# Patient Record
Sex: Male | Born: 1950 | Race: Black or African American | Hispanic: No | Marital: Single | Smoking: Current some day smoker
Health system: Southern US, Community
[De-identification: ages and names within clinical notes are randomized; demographics above are authoritative.]

## PROBLEM LIST (undated history)

## (undated) DIAGNOSIS — I10 Essential (primary) hypertension: Secondary | ICD-10-CM

## (undated) DIAGNOSIS — I639 Cerebral infarction, unspecified: Secondary | ICD-10-CM

## (undated) DIAGNOSIS — C801 Malignant (primary) neoplasm, unspecified: Secondary | ICD-10-CM

## (undated) HISTORY — DX: Essential (primary) hypertension: I10

---

## 2007-11-23 ENCOUNTER — Emergency Department (HOSPITAL_COMMUNITY): Admission: EM | Admit: 2007-11-23 | Discharge: 2007-11-23 | Payer: Self-pay | Admitting: Family Medicine

## 2007-12-21 ENCOUNTER — Emergency Department (HOSPITAL_COMMUNITY): Admission: EM | Admit: 2007-12-21 | Discharge: 2007-12-21 | Payer: Self-pay | Admitting: Emergency Medicine

## 2008-03-18 ENCOUNTER — Emergency Department (HOSPITAL_COMMUNITY): Admission: EM | Admit: 2008-03-18 | Discharge: 2008-03-18 | Payer: Self-pay | Admitting: Emergency Medicine

## 2008-04-26 HISTORY — PX: OTHER SURGICAL HISTORY: SHX169

## 2008-06-27 ENCOUNTER — Emergency Department (HOSPITAL_COMMUNITY): Admission: EM | Admit: 2008-06-27 | Discharge: 2008-06-27 | Payer: Self-pay | Admitting: Family Medicine

## 2008-07-22 ENCOUNTER — Emergency Department (HOSPITAL_COMMUNITY): Admission: EM | Admit: 2008-07-22 | Discharge: 2008-07-22 | Payer: Self-pay | Admitting: Emergency Medicine

## 2008-07-26 ENCOUNTER — Inpatient Hospital Stay (HOSPITAL_COMMUNITY): Admission: EM | Admit: 2008-07-26 | Discharge: 2008-08-09 | Payer: Self-pay | Admitting: *Deleted

## 2008-08-20 ENCOUNTER — Encounter: Admission: RE | Admit: 2008-08-20 | Discharge: 2008-11-18 | Payer: Self-pay | Admitting: Plastic Surgery

## 2008-11-19 ENCOUNTER — Encounter: Admission: RE | Admit: 2008-11-19 | Discharge: 2009-01-22 | Payer: Self-pay | Admitting: Plastic Surgery
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
## 2010-02-22 ENCOUNTER — Emergency Department (HOSPITAL_COMMUNITY): Admission: EM | Admit: 2010-02-22 | Discharge: 2010-02-22 | Payer: Self-pay | Admitting: Emergency Medicine

## 2010-08-05 LAB — CBC
HCT: 26.8 % — ABNORMAL LOW (ref 39.0–52.0)
HCT: 29.1 % — ABNORMAL LOW (ref 39.0–52.0)
HCT: 31.8 % — ABNORMAL LOW (ref 39.0–52.0)
HCT: 42.8 % (ref 39.0–52.0)
Hemoglobin: 10 g/dL — ABNORMAL LOW (ref 13.0–17.0)
Hemoglobin: 10.8 g/dL — ABNORMAL LOW (ref 13.0–17.0)
Hemoglobin: 14.3 g/dL (ref 13.0–17.0)
Hemoglobin: 9.2 g/dL — ABNORMAL LOW (ref 13.0–17.0)
MCHC: 33.5 g/dL (ref 30.0–36.0)
MCHC: 33.7 g/dL (ref 30.0–36.0)
MCHC: 34.2 g/dL (ref 30.0–36.0)
MCHC: 34.3 g/dL (ref 30.0–36.0)
MCV: 96.7 fL (ref 78.0–100.0)
MCV: 97 fL (ref 78.0–100.0)
MCV: 98.7 fL (ref 78.0–100.0)
MCV: 98.9 fL (ref 78.0–100.0)
Platelets: 106 10*3/uL — ABNORMAL LOW (ref 150–400)
Platelets: 112 10*3/uL — ABNORMAL LOW (ref 150–400)
Platelets: 115 10*3/uL — ABNORMAL LOW (ref 150–400)
Platelets: 162 10*3/uL (ref 150–400)
Platelets: 177 10*3/uL (ref 150–400)
RBC: 2.76 MIL/uL — ABNORMAL LOW (ref 4.22–5.81)
RBC: 3.01 MIL/uL — ABNORMAL LOW (ref 4.22–5.81)
RBC: 3.22 MIL/uL — ABNORMAL LOW (ref 4.22–5.81)
RBC: 4.33 MIL/uL (ref 4.22–5.81)
RDW: 12.8 % (ref 11.5–15.5)
RDW: 13.3 % (ref 11.5–15.5)
RDW: 13.5 % (ref 11.5–15.5)
RDW: 13.7 % (ref 11.5–15.5)
WBC: 4.2 10*3/uL (ref 4.0–10.5)
WBC: 4.5 10*3/uL (ref 4.0–10.5)
WBC: 5.2 10*3/uL (ref 4.0–10.5)
WBC: 5.8 10*3/uL (ref 4.0–10.5)
WBC: 7.2 10*3/uL (ref 4.0–10.5)

## 2010-08-05 LAB — DIFFERENTIAL
Basophils Absolute: 0 10*3/uL (ref 0.0–0.1)
Basophils Relative: 1 % (ref 0–1)
Eosinophils Absolute: 0.1 10*3/uL (ref 0.0–0.7)
Eosinophils Relative: 1 % (ref 0–5)
Lymphocytes Relative: 57 % — ABNORMAL HIGH (ref 12–46)
Lymphs Abs: 4.1 10*3/uL — ABNORMAL HIGH (ref 0.7–4.0)
Monocytes Absolute: 0.6 10*3/uL (ref 0.1–1.0)
Monocytes Relative: 8 % (ref 3–12)
Neutro Abs: 2.4 10*3/uL (ref 1.7–7.7)
Neutrophils Relative %: 33 % — ABNORMAL LOW (ref 43–77)

## 2010-08-05 LAB — COMPREHENSIVE METABOLIC PANEL
ALT: 38 U/L (ref 0–53)
AST: 66 U/L — ABNORMAL HIGH (ref 0–37)
Albumin: 2.8 g/dL — ABNORMAL LOW (ref 3.5–5.2)
Albumin: 3.8 g/dL (ref 3.5–5.2)
Alkaline Phosphatase: 75 U/L (ref 39–117)
BUN: 11 mg/dL (ref 6–23)
BUN: 7 mg/dL (ref 6–23)
CO2: 18 mEq/L — ABNORMAL LOW (ref 19–32)
Calcium: 8.3 mg/dL — ABNORMAL LOW (ref 8.4–10.5)
Calcium: 9.1 mg/dL (ref 8.4–10.5)
Chloride: 100 mEq/L (ref 96–112)
Chloride: 103 mEq/L (ref 96–112)
Creatinine, Ser: 1.08 mg/dL (ref 0.4–1.5)
Creatinine, Ser: 1.53 mg/dL — ABNORMAL HIGH (ref 0.4–1.5)
GFR calc Af Amer: 57 mL/min — ABNORMAL LOW (ref >60)
GFR calc non Af Amer: 47 mL/min — ABNORMAL LOW (ref >60)
Glucose, Bld: 126 mg/dL — ABNORMAL HIGH (ref 70–99)
Potassium: 4.1 mEq/L (ref 3.5–5.1)
Sodium: 140 mEq/L (ref 135–145)
Total Bilirubin: 0.9 mg/dL (ref 0.3–1.2)
Total Bilirubin: 1.3 mg/dL — ABNORMAL HIGH (ref 0.3–1.2)
Total Protein: 7.2 g/dL (ref 6.0–8.3)

## 2010-08-05 LAB — PROTIME-INR
INR: 0.9 (ref 0.00–1.49)
Prothrombin Time: 12.7 seconds (ref 11.6–15.2)

## 2010-08-05 LAB — BASIC METABOLIC PANEL
BUN: 5 mg/dL — ABNORMAL LOW (ref 6–23)
BUN: 8 mg/dL (ref 6–23)
CO2: 30 mEq/L (ref 19–32)
Calcium: 8.4 mg/dL (ref 8.4–10.5)
Chloride: 98 mEq/L (ref 96–112)
Creatinine, Ser: 0.79 mg/dL (ref 0.4–1.5)
Creatinine, Ser: 1.02 mg/dL (ref 0.4–1.5)
GFR calc Af Amer: >60 mL/min (ref >60)
GFR calc Af Amer: >60 mL/min (ref >60)
GFR calc non Af Amer: >60 mL/min (ref >60)
GFR calc non Af Amer: >60 mL/min (ref >60)
Glucose, Bld: 101 mg/dL — ABNORMAL HIGH (ref 70–99)
Potassium: 3.6 mEq/L (ref 3.5–5.1)
Potassium: 3.7 mEq/L (ref 3.5–5.1)
Sodium: 135 mEq/L (ref 135–145)

## 2010-08-05 LAB — T4, FREE: Free T4: 1.04 ng/dL (ref 0.89–1.80)

## 2010-08-05 LAB — CREATININE, URINE, RANDOM: Creatinine, Urine: 70.4 mg/dL

## 2010-08-05 LAB — APTT: aPTT: 29 seconds (ref 24–37)

## 2010-08-05 LAB — ABO/RH: ABO/RH(D): B POS

## 2010-08-05 LAB — TYPE AND SCREEN
ABO/RH(D): B POS
Antibody Screen: NEGATIVE

## 2010-08-05 LAB — CK: Total CK: 153 U/L (ref 7–232)

## 2010-08-05 LAB — GLUCOSE, CAPILLARY: Glucose-Capillary: 204 mg/dL — ABNORMAL HIGH (ref 70–99)

## 2010-08-05 LAB — TSH: TSH: 1.964 u[IU]/mL (ref 0.350–4.500)

## 2010-08-05 LAB — ETHANOL: Alcohol, Ethyl (B): 9 mg/dL (ref 0–10)

## 2010-08-05 LAB — CK TOTAL AND CKMB (NOT AT ARMC)
CK, MB: 2.3 ng/mL (ref 0.3–4.0)
Relative Index: 0.3 (ref 0.0–2.5)
Total CK: 762 U/L — ABNORMAL HIGH (ref 7–232)

## 2010-08-05 LAB — SODIUM, URINE, RANDOM: Sodium, Ur: 39 mEq/L
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
## 2010-10-06 ENCOUNTER — Inpatient Hospital Stay (INDEPENDENT_AMBULATORY_CARE_PROVIDER_SITE_OTHER)
Admission: RE | Admit: 2010-10-06 | Discharge: 2010-10-06 | Disposition: A | Discharge status: Home / Self Care | Payer: Self-pay | Source: Ambulatory Visit | Attending: Family Medicine | Admitting: Family Medicine

## 2010-10-06 DIAGNOSIS — B351 Tinea unguium: Secondary | ICD-10-CM

## 2010-10-06 DIAGNOSIS — J45909 Unspecified asthma, uncomplicated: Secondary | ICD-10-CM

## 2010-10-06 DIAGNOSIS — I10 Essential (primary) hypertension: Secondary | ICD-10-CM

## 2010-10-13 ENCOUNTER — Other Ambulatory Visit: Payer: Self-pay | Admitting: Infectious Diseases

## 2010-10-13 ENCOUNTER — Ambulatory Visit
Admission: RE | Admit: 2010-10-13 | Discharge: 2010-10-13 | Disposition: A | Discharge status: Home / Self Care | Payer: No Typology Code available for payment source | Source: Ambulatory Visit | Attending: Infectious Diseases | Admitting: Infectious Diseases

## 2010-10-13 DIAGNOSIS — R7611 Nonspecific reaction to tuberculin skin test without active tuberculosis: Secondary | ICD-10-CM

## 2010-11-01 ENCOUNTER — Emergency Department (HOSPITAL_COMMUNITY)
Admission: EM | Admit: 2010-11-01 | Discharge: 2010-11-02 | Disposition: A | Discharge status: Home / Self Care | Payer: Self-pay | Attending: Emergency Medicine | Admitting: Emergency Medicine

## 2010-11-01 DIAGNOSIS — Z79899 Other long term (current) drug therapy: Secondary | ICD-10-CM | POA: Insufficient documentation

## 2010-11-01 DIAGNOSIS — Z87828 Personal history of other (healed) physical injury and trauma: Secondary | ICD-10-CM | POA: Insufficient documentation

## 2010-11-01 DIAGNOSIS — J45909 Unspecified asthma, uncomplicated: Secondary | ICD-10-CM | POA: Insufficient documentation

## 2010-11-01 DIAGNOSIS — I10 Essential (primary) hypertension: Secondary | ICD-10-CM | POA: Insufficient documentation

## 2010-11-01 DIAGNOSIS — M79609 Pain in unspecified limb: Secondary | ICD-10-CM | POA: Insufficient documentation

## 2010-11-06 ENCOUNTER — Emergency Department (HOSPITAL_COMMUNITY): Payer: Self-pay

## 2010-11-06 ENCOUNTER — Emergency Department (HOSPITAL_COMMUNITY)
Admission: EM | Admit: 2010-11-06 | Discharge: 2010-11-06 | Disposition: A | Discharge status: Home / Self Care | Payer: Self-pay | Attending: Emergency Medicine | Admitting: Emergency Medicine

## 2010-11-06 DIAGNOSIS — J45909 Unspecified asthma, uncomplicated: Secondary | ICD-10-CM | POA: Insufficient documentation

## 2010-11-06 DIAGNOSIS — M79609 Pain in unspecified limb: Secondary | ICD-10-CM | POA: Insufficient documentation

## 2010-11-06 DIAGNOSIS — L84 Corns and callosities: Secondary | ICD-10-CM | POA: Insufficient documentation

## 2010-11-06 DIAGNOSIS — I10 Essential (primary) hypertension: Secondary | ICD-10-CM | POA: Insufficient documentation

## 2010-11-06 DIAGNOSIS — B351 Tinea unguium: Secondary | ICD-10-CM | POA: Insufficient documentation

## 2010-11-13 ENCOUNTER — Emergency Department (HOSPITAL_COMMUNITY)
Admission: EM | Admit: 2010-11-13 | Discharge: 2010-11-13 | Disposition: A | Discharge status: Home / Self Care | Payer: Self-pay | Attending: Emergency Medicine | Admitting: Emergency Medicine

## 2010-11-13 DIAGNOSIS — J45909 Unspecified asthma, uncomplicated: Secondary | ICD-10-CM | POA: Insufficient documentation

## 2010-11-13 DIAGNOSIS — Z8639 Personal history of other endocrine, nutritional and metabolic disease: Secondary | ICD-10-CM | POA: Insufficient documentation

## 2010-11-13 DIAGNOSIS — Z862 Personal history of diseases of the blood and blood-forming organs and certain disorders involving the immune mechanism: Secondary | ICD-10-CM | POA: Insufficient documentation

## 2010-11-13 DIAGNOSIS — I10 Essential (primary) hypertension: Secondary | ICD-10-CM | POA: Insufficient documentation

## 2010-11-13 DIAGNOSIS — M79609 Pain in unspecified limb: Secondary | ICD-10-CM | POA: Insufficient documentation

## 2010-11-23 ENCOUNTER — Emergency Department (HOSPITAL_COMMUNITY): Payer: Self-pay

## 2010-11-23 ENCOUNTER — Emergency Department (HOSPITAL_COMMUNITY)
Admission: EM | Admit: 2010-11-23 | Discharge: 2010-11-23 | Disposition: A | Discharge status: Home / Self Care | Payer: Self-pay | Attending: Emergency Medicine | Admitting: Emergency Medicine

## 2010-11-23 DIAGNOSIS — Z79899 Other long term (current) drug therapy: Secondary | ICD-10-CM | POA: Insufficient documentation

## 2010-11-23 DIAGNOSIS — I10 Essential (primary) hypertension: Secondary | ICD-10-CM | POA: Insufficient documentation

## 2010-11-23 DIAGNOSIS — M5137 Other intervertebral disc degeneration, lumbosacral region: Secondary | ICD-10-CM | POA: Insufficient documentation

## 2010-11-23 DIAGNOSIS — Z862 Personal history of diseases of the blood and blood-forming organs and certain disorders involving the immune mechanism: Secondary | ICD-10-CM | POA: Insufficient documentation

## 2010-11-23 DIAGNOSIS — M7989 Other specified soft tissue disorders: Secondary | ICD-10-CM | POA: Insufficient documentation

## 2010-11-23 DIAGNOSIS — X58XXXA Exposure to other specified factors, initial encounter: Secondary | ICD-10-CM | POA: Insufficient documentation

## 2010-11-23 DIAGNOSIS — M51379 Other intervertebral disc degeneration, lumbosacral region without mention of lumbar back pain or lower extremity pain: Secondary | ICD-10-CM | POA: Insufficient documentation

## 2010-11-23 DIAGNOSIS — M545 Low back pain, unspecified: Secondary | ICD-10-CM | POA: Insufficient documentation

## 2010-11-23 DIAGNOSIS — S335XXA Sprain of ligaments of lumbar spine, initial encounter: Secondary | ICD-10-CM | POA: Insufficient documentation

## 2010-11-23 DIAGNOSIS — M79609 Pain in unspecified limb: Secondary | ICD-10-CM | POA: Insufficient documentation

## 2010-11-23 DIAGNOSIS — Z8639 Personal history of other endocrine, nutritional and metabolic disease: Secondary | ICD-10-CM | POA: Insufficient documentation

## 2010-11-24 ENCOUNTER — Encounter: Payer: Self-pay | Admitting: Podiatry

## 2010-11-24 DIAGNOSIS — I10 Essential (primary) hypertension: Secondary | ICD-10-CM

## 2011-01-17 ENCOUNTER — Emergency Department (HOSPITAL_COMMUNITY): Payer: Self-pay

## 2011-01-17 ENCOUNTER — Emergency Department (HOSPITAL_COMMUNITY)
Admission: EM | Admit: 2011-01-17 | Discharge: 2011-01-18 | Disposition: A | Discharge status: Home / Self Care | Payer: Self-pay | Attending: Emergency Medicine | Admitting: Emergency Medicine

## 2011-01-17 DIAGNOSIS — W010XXA Fall on same level from slipping, tripping and stumbling without subsequent striking against object, initial encounter: Secondary | ICD-10-CM | POA: Insufficient documentation

## 2011-01-17 DIAGNOSIS — M79609 Pain in unspecified limb: Secondary | ICD-10-CM | POA: Insufficient documentation

## 2011-01-17 DIAGNOSIS — M25539 Pain in unspecified wrist: Secondary | ICD-10-CM | POA: Insufficient documentation

## 2011-01-17 DIAGNOSIS — M21619 Bunion of unspecified foot: Secondary | ICD-10-CM | POA: Insufficient documentation

## 2011-01-17 DIAGNOSIS — I10 Essential (primary) hypertension: Secondary | ICD-10-CM | POA: Insufficient documentation

## 2011-01-17 DIAGNOSIS — Z862 Personal history of diseases of the blood and blood-forming organs and certain disorders involving the immune mechanism: Secondary | ICD-10-CM | POA: Insufficient documentation

## 2011-01-17 DIAGNOSIS — Z8639 Personal history of other endocrine, nutritional and metabolic disease: Secondary | ICD-10-CM | POA: Insufficient documentation

## 2011-01-19 ENCOUNTER — Emergency Department (HOSPITAL_COMMUNITY)
Admission: EM | Admit: 2011-01-19 | Discharge: 2011-01-19 | Disposition: A | Discharge status: Home / Self Care | Payer: Self-pay | Attending: Emergency Medicine | Admitting: Emergency Medicine

## 2011-04-08 ENCOUNTER — Emergency Department (HOSPITAL_COMMUNITY)
Admission: EM | Admit: 2011-04-08 | Discharge: 2011-04-08 | Disposition: A | Discharge status: Home / Self Care | Payer: Self-pay | Attending: Emergency Medicine | Admitting: Emergency Medicine

## 2011-04-08 ENCOUNTER — Encounter (HOSPITAL_COMMUNITY): Payer: Self-pay

## 2011-04-08 DIAGNOSIS — M19079 Primary osteoarthritis, unspecified ankle and foot: Secondary | ICD-10-CM | POA: Insufficient documentation

## 2011-04-08 DIAGNOSIS — M1990 Unspecified osteoarthritis, unspecified site: Secondary | ICD-10-CM

## 2011-04-08 DIAGNOSIS — B351 Tinea unguium: Secondary | ICD-10-CM | POA: Insufficient documentation

## 2011-04-08 DIAGNOSIS — M79672 Pain in left foot: Secondary | ICD-10-CM

## 2011-04-08 DIAGNOSIS — M79609 Pain in unspecified limb: Secondary | ICD-10-CM | POA: Insufficient documentation

## 2011-04-08 MED ORDER — TERBINAFINE HCL 1 % EX CREA: TOPICAL_CREAM | Freq: Two times a day (BID) | CUTANEOUS | Status: DC

## 2011-04-08 MED ORDER — INDOMETHACIN 50 MG PO CAPS: 50.0000 mg | ORAL_CAPSULE | Freq: Two times a day (BID) | ORAL | Status: AC

## 2011-04-08 NOTE — ED Provider Notes (Signed)
Medical screening examination/treatment/procedure(s) were performed by non-physician practitioner and as supervising physician I was immediately available for consultation/collaboration.  Flint Melter, MD 04/08/11 [phone]

## 2011-04-08 NOTE — ED Notes (Signed)
Lt,. Foot pain pt. Believes he might have GOUT,. And also has a bulllet in his lt. Forearm and it hurts when the weather become cold
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
## 2011-11-03 ENCOUNTER — Emergency Department (HOSPITAL_COMMUNITY): Payer: No Typology Code available for payment source

## 2011-11-03 ENCOUNTER — Emergency Department (HOSPITAL_COMMUNITY)
Admission: EM | Admit: 2011-11-03 | Discharge: 2011-11-03 | Disposition: A | Discharge status: Home / Self Care | Payer: No Typology Code available for payment source | Attending: Emergency Medicine | Admitting: Emergency Medicine

## 2011-11-03 ENCOUNTER — Encounter (HOSPITAL_COMMUNITY): Payer: Self-pay | Admitting: Emergency Medicine

## 2011-11-03 DIAGNOSIS — I10 Essential (primary) hypertension: Secondary | ICD-10-CM | POA: Insufficient documentation

## 2011-11-03 DIAGNOSIS — F17200 Nicotine dependence, unspecified, uncomplicated: Secondary | ICD-10-CM | POA: Insufficient documentation

## 2011-11-03 DIAGNOSIS — Y92410 Unspecified street and highway as the place of occurrence of the external cause: Secondary | ICD-10-CM | POA: Insufficient documentation

## 2011-11-03 MED ORDER — CLONIDINE HCL 0.2 MG PO TABS: 0.2000 mg | ORAL_TABLET | Freq: Two times a day (BID) | ORAL | Status: DC

## 2011-11-03 MED ORDER — HYDROCODONE-ACETAMINOPHEN 5-325 MG PO TABS
1.0000 | ORAL_TABLET | Freq: Once | ORAL | Status: AC
  Administered 2011-11-03: 1 via ORAL
  Filled 2011-11-03: qty 1

## 2011-11-03 MED ORDER — HYDROCODONE-ACETAMINOPHEN 5-325 MG PO TABS: 1.0000 | ORAL_TABLET | Freq: Four times a day (QID) | ORAL | Status: DC | PRN

## 2011-11-03 MED ORDER — CLONIDINE HCL 0.1 MG PO TABS
0.2000 mg | ORAL_TABLET | Freq: Once | ORAL | Status: AC
  Administered 2011-11-03: 0.2 mg via ORAL
  Filled 2011-11-03: qty 2

## 2011-11-03 MED ORDER — KETOROLAC TROMETHAMINE 60 MG/2ML IM SOLN
60.0000 mg | Freq: Once | INTRAMUSCULAR | Status: AC
  Administered 2011-11-03: 60 mg via INTRAMUSCULAR
  Filled 2011-11-03: qty 2

## 2011-11-03 MED ORDER — LORAZEPAM 1 MG PO TABS
1.0000 mg | ORAL_TABLET | Freq: Once | ORAL | Status: AC
  Administered 2011-11-03: 1 mg via ORAL
  Filled 2011-11-03: qty 1
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
## 2011-11-03 NOTE — Progress Notes (Signed)
Pt reports going to DSS, social security and the school this morning with his family prior to [facility]
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
## 2011-11-03 NOTE — ED Notes (Signed)
Discharge instructions reviewed w/ pt., verbalizes understanding. Two prescriptions provided at discharge. 

## 2011-11-03 NOTE — Progress Notes (Signed)
[facility] ED CM spoke with pt who states he is medicaid He presented his card which CM provided to ED registration to copy and enter in [facility] Medicaid card lists [facility] Pt states he did not have pcp and interested in seeing a provider- Dr Mayford Knife (clinic) on [address] [place] across from [employer]

## 2011-11-04 ENCOUNTER — Encounter (HOSPITAL_COMMUNITY): Payer: Self-pay | Admitting: *Deleted

## 2011-11-04 ENCOUNTER — Emergency Department (HOSPITAL_COMMUNITY): Payer: Medicaid Other

## 2011-11-04 ENCOUNTER — Inpatient Hospital Stay (HOSPITAL_COMMUNITY)
Admission: EM | Admit: 2011-11-04 | Discharge: 2011-11-11 | DRG: 536 | Disposition: A | Discharge status: Rehabilitation Facility | Payer: Medicaid Other | Attending: General Surgery | Admitting: General Surgery

## 2011-11-04 DIAGNOSIS — R112 Nausea with vomiting, unspecified: Secondary | ICD-10-CM | POA: Diagnosis not present

## 2011-11-04 DIAGNOSIS — Y92410 Unspecified street and highway as the place of occurrence of the external cause: Secondary | ICD-10-CM

## 2011-11-04 DIAGNOSIS — I10 Essential (primary) hypertension: Secondary | ICD-10-CM | POA: Diagnosis present

## 2011-11-04 DIAGNOSIS — S32599A Other specified fracture of unspecified pubis, initial encounter for closed fracture: Secondary | ICD-10-CM | POA: Diagnosis present

## 2011-11-04 DIAGNOSIS — S32810A Multiple fractures of pelvis with stable disruption of pelvic ring, initial encounter for closed fracture: Secondary | ICD-10-CM

## 2011-11-04 DIAGNOSIS — S3210XA Unspecified fracture of sacrum, initial encounter for closed fracture: Secondary | ICD-10-CM | POA: Diagnosis present

## 2011-11-04 DIAGNOSIS — S329XXA Fracture of unspecified parts of lumbosacral spine and pelvis, initial encounter for closed fracture: Secondary | ICD-10-CM

## 2011-11-04 DIAGNOSIS — J9819 Other pulmonary collapse: Secondary | ICD-10-CM | POA: Diagnosis present

## 2011-11-04 DIAGNOSIS — S32401A Unspecified fracture of right acetabulum, initial encounter for closed fracture: Secondary | ICD-10-CM | POA: Diagnosis present

## 2011-11-04 DIAGNOSIS — F17200 Nicotine dependence, unspecified, uncomplicated: Secondary | ICD-10-CM | POA: Diagnosis present

## 2011-11-04 DIAGNOSIS — Y998 Other external cause status: Secondary | ICD-10-CM

## 2011-11-04 DIAGNOSIS — Z79899 Other long term (current) drug therapy: Secondary | ICD-10-CM

## 2011-11-04 DIAGNOSIS — D62 Acute posthemorrhagic anemia: Secondary | ICD-10-CM | POA: Diagnosis not present

## 2011-11-04 DIAGNOSIS — S3282XA Multiple fractures of pelvis without disruption of pelvic ring, initial encounter for closed fracture: Principal | ICD-10-CM | POA: Diagnosis present

## 2011-11-04 LAB — CBC WITH DIFFERENTIAL/PLATELET
Eosinophils Absolute: 0 10*3/uL (ref 0.0–0.7)
Eosinophils Relative: 0 % (ref 0–5)
HCT: 38 % — ABNORMAL LOW (ref 39.0–52.0)
Lymphocytes Relative: 11 % — ABNORMAL LOW (ref 12–46)
Lymphs Abs: 0.8 10*3/uL (ref 0.7–4.0)
MCH: 32.9 pg (ref 26.0–34.0)
MCV: 92.7 fL (ref 78.0–100.0)
Monocytes Absolute: 0.6 10*3/uL (ref 0.1–1.0)
Monocytes Relative: 7 % (ref 3–12)
RBC: 4.1 MIL/uL — ABNORMAL LOW (ref 4.22–5.81)
WBC: 7.8 10*3/uL (ref 4.0–10.5)

## 2011-11-04 LAB — URINALYSIS, ROUTINE W REFLEX MICROSCOPIC
Nitrite: NEGATIVE
Protein, ur: 100 mg/dL — AB
Urobilinogen, UA: 1 mg/dL (ref 0.0–1.0)

## 2011-11-04 LAB — COMPREHENSIVE METABOLIC PANEL
ALT: 38 U/L (ref 0–53)
BUN: 22 mg/dL (ref 6–23)
CO2: 23 mEq/L (ref 19–32)
Calcium: 9.3 mg/dL (ref 8.4–10.5)
Creatinine, Ser: 1.01 mg/dL (ref 0.50–1.35)
GFR calc Af Amer: >90 mL/min (ref >90)
GFR calc non Af Amer: 78 mL/min — ABNORMAL LOW (ref >90)
Glucose, Bld: 102 mg/dL — ABNORMAL HIGH (ref 70–99)

## 2011-11-04 LAB — URINE MICROSCOPIC-ADD ON

## 2011-11-04 LAB — LIPASE, BLOOD: Lipase: 10 U/L — ABNORMAL LOW (ref 11–59)

## 2011-11-04 MED ORDER — SODIUM CHLORIDE 0.9 % IV BOLUS (SEPSIS)
500.0000 mL | Freq: Once | INTRAVENOUS | Status: AC
  Administered 2011-11-04: 500 mL via INTRAVENOUS

## 2011-11-04 MED ORDER — HYDROMORPHONE HCL PF 1 MG/ML IJ SOLN
1.0000 mg | Freq: Once | INTRAMUSCULAR | Status: AC
  Administered 2011-11-04: 1 mg via INTRAVENOUS
  Filled 2011-11-04: qty 1

## 2011-11-04 MED ORDER — IOHEXOL 300 MG/ML  SOLN
100.0000 mL | Freq: Once | INTRAMUSCULAR | Status: AC | PRN
  Administered 2011-11-04: 100 mL via INTRAVENOUS

## 2011-11-04 MED ORDER — ONDANSETRON HCL 4 MG/2ML IJ SOLN
4.0000 mg | Freq: Once | INTRAMUSCULAR | Status: AC
  Administered 2011-11-04: 4 mg via INTRAVENOUS
  Filled 2011-11-04: qty 2

## 2011-11-04 MED ORDER — SODIUM CHLORIDE 0.9 % IV SOLN
INTRAVENOUS | Status: DC
  Administered 2011-11-04 (×2): via INTRAVENOUS
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
## 2011-11-04 NOTE — ED Notes (Signed)
Bed:WA10<BR> Expected date:<BR> Expected time:<BR> Means of arrival:<BR> Comments:<BR>

## 2011-11-04 NOTE — ED Notes (Signed)
BJY:[id number]<AO> Expected date:11/04/11<BR> Expected time: 3:03 PM<BR> Means of arrival:Ambulance<BR> Comments:<BR> ems-back pain states hard to sit up

## 2011-11-04 NOTE — ED Notes (Signed)
Pt. Returned from CT.

## 2011-11-04 NOTE — ED Notes (Signed)
Patient is now complaining of chest pain and bloody urine. The patient is unable to ambulate at all.
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
## 2011-11-05 ENCOUNTER — Inpatient Hospital Stay (HOSPITAL_COMMUNITY): Payer: Medicaid Other

## 2011-11-05 DIAGNOSIS — S3210XA Unspecified fracture of sacrum, initial encounter for closed fracture: Secondary | ICD-10-CM | POA: Diagnosis present

## 2011-11-05 DIAGNOSIS — S32599A Other specified fracture of unspecified pubis, initial encounter for closed fracture: Secondary | ICD-10-CM | POA: Diagnosis present

## 2011-11-05 DIAGNOSIS — D62 Acute posthemorrhagic anemia: Secondary | ICD-10-CM | POA: Diagnosis not present

## 2011-11-05 DIAGNOSIS — S32401A Unspecified fracture of right acetabulum, initial encounter for closed fracture: Secondary | ICD-10-CM | POA: Diagnosis present

## 2011-11-05 LAB — BASIC METABOLIC PANEL
CO2: 24 mEq/L (ref 19–32)
Chloride: 99 mEq/L (ref 96–112)
Creatinine, Ser: 1.05 mg/dL (ref 0.50–1.35)
GFR calc Af Amer: 87 mL/min — ABNORMAL LOW (ref >90)
Glucose, Bld: 106 mg/dL — ABNORMAL HIGH (ref 70–99)
Sodium: 136 mEq/L (ref 135–145)

## 2011-11-05 LAB — CBC
Hemoglobin: 11.8 g/dL — ABNORMAL LOW (ref 13.0–17.0)
MCH: 32.4 pg (ref 26.0–34.0)
MCHC: 34.9 g/dL (ref 30.0–36.0)
Platelets: 85 10*3/uL — ABNORMAL LOW (ref 150–400)
RDW: 12.3 % (ref 11.5–15.5)

## 2011-11-05 MED ORDER — MORPHINE SULFATE 2 MG/ML IJ SOLN
INTRAMUSCULAR | Status: AC
  Administered 2011-11-05: 2 mg via INTRAVENOUS
  Filled 2011-11-05: qty 1

## 2011-11-05 MED ORDER — HYDROCODONE-ACETAMINOPHEN 5-325 MG PO TABS
1.0000 | ORAL_TABLET | Freq: Four times a day (QID) | ORAL | Status: DC | PRN
  Administered 2011-11-05: 1 via ORAL
  Filled 2011-11-05: qty 1

## 2011-11-05 MED ORDER — CLONIDINE HCL 0.2 MG PO TABS
0.2000 mg | ORAL_TABLET | Freq: Two times a day (BID) | ORAL | Status: DC
  Administered 2011-11-05 – 2011-11-11 (×14): 0.2 mg via ORAL
  Filled 2011-11-05 (×15): qty 1

## 2011-11-05 MED ORDER — MORPHINE SULFATE 2 MG/ML IJ SOLN
1.0000 mg | INTRAMUSCULAR | Status: DC | PRN
  Administered 2011-11-05 (×2): 2 mg via INTRAVENOUS
  Administered 2011-11-05: 1 mg via INTRAVENOUS
  Filled 2011-11-05 (×2): qty 1

## 2011-11-05 MED ORDER — TRAMADOL HCL 50 MG PO TABS
100.0000 mg | ORAL_TABLET | Freq: Four times a day (QID) | ORAL | Status: DC
  Administered 2011-11-05 – 2011-11-11 (×24): 100 mg via ORAL
  Filled 2011-11-05 (×24): qty 2

## 2011-11-05 MED ORDER — MORPHINE SULFATE 2 MG/ML IJ SOLN
2.0000 mg | INTRAMUSCULAR | Status: DC | PRN
  Administered 2011-11-06 – 2011-11-09 (×12): 2 mg via INTRAVENOUS
  Filled 2011-11-05 (×14): qty 1

## 2011-11-05 MED ORDER — ENSURE COMPLETE PO LIQD
237.0000 mL | Freq: Three times a day (TID) | ORAL | Status: DC
  Administered 2011-11-05 – 2011-11-11 (×16): 237 mL via ORAL
  Filled 2011-11-05: qty 237

## 2011-11-05 MED ORDER — METHOCARBAMOL 500 MG PO TABS
1000.0000 mg | ORAL_TABLET | Freq: Four times a day (QID) | ORAL | Status: DC
  Administered 2011-11-05 – 2011-11-11 (×22): 1000 mg via ORAL
  Filled 2011-11-05 (×5): qty 2
  Filled 2011-11-05: qty 1
  Filled 2011-11-05 (×7): qty 2
  Filled 2011-11-05: qty 1
  Filled 2011-11-05 (×3): qty 2
  Filled 2011-11-05: qty 1
  Filled 2011-11-05 (×16): qty 2

## 2011-11-05 MED ORDER — HYDROCODONE-ACETAMINOPHEN 5-325 MG PO TABS
1.0000 | ORAL_TABLET | ORAL | Status: DC | PRN
  Administered 2011-11-05 – 2011-11-06 (×4): 2 via ORAL
  Filled 2011-11-05 (×4): qty 2

## 2011-11-05 MED ORDER — DOCUSATE SODIUM 100 MG PO CAPS
100.0000 mg | ORAL_CAPSULE | Freq: Every day | ORAL | Status: DC
  Administered 2011-11-05 – 2011-11-11 (×6): 100 mg via ORAL
  Filled 2011-11-05 (×7): qty 1

## 2011-11-05 MED ORDER — ENOXAPARIN SODIUM 40 MG/0.4ML ~~LOC~~ SOLN
40.0000 mg | SUBCUTANEOUS | Status: DC
  Administered 2011-11-05 – 2011-11-11 (×7): 40 mg via SUBCUTANEOUS
  Filled 2011-11-05 (×10): qty 0.4

## 2011-11-05 MED ORDER — POTASSIUM CHLORIDE IN NACL 20-0.45 MEQ/L-% IV SOLN
INTRAVENOUS | Status: DC
  Administered 2011-11-05: 04:00:00 via INTRAVENOUS
  Filled 2011-11-05 (×2): qty 1000

## 2011-11-05 NOTE — Progress Notes (Signed)
UR complete 
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
## 2011-11-05 NOTE — Progress Notes (Signed)
Physical Therapy Note   11/05/11 1400  PT Visit Information  Last PT Received On 11/05/11  Reason Eval/Treat Not Completed Patient refused due to increased pain. RN staff reports "he's in so much pain they can't even change his linens beneath him."  PT General Charges  $PT Canceled Visit 1 Procedure    PT to re-attempt as able.  Lewis Shock, PT, DPT Pager #: [phone] Office #: [phone]

## 2011-11-05 NOTE — Consult Note (Signed)
I saw and examined the patient immediately following Mr. Renae Fickle.  I agree with the findings above.  I communicated the plan noted above. We will see again on Monday if patient fails to progress sufficiently for discharge.   Budd Palmer, MD 11/05/2011 1:13 PM

## 2011-11-06 DIAGNOSIS — I10 Essential (primary) hypertension: Secondary | ICD-10-CM

## 2011-11-06 DIAGNOSIS — R5381 Other malaise: Secondary | ICD-10-CM

## 2011-11-06 MED ORDER — HYDROCODONE-ACETAMINOPHEN 10-325 MG PO TABS
1.0000 | ORAL_TABLET | ORAL | Status: DC | PRN
  Administered 2011-11-06 – 2011-11-08 (×7): 2 via ORAL
  Filled 2011-11-06 (×7): qty 2

## 2011-11-06 NOTE — Progress Notes (Signed)
Patient's ordered dose of Morphine given before next available administration in error.  Pt was working with OT and has continued to be in extreme pain without relief.  Pain continues to be 9/10.  MD notified.  No new orders.  Will continue to monitor.  Vitals WNL.
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
## 2011-11-06 NOTE — Progress Notes (Signed)
Occupational Therapy Treatment Patient Details Name: DONATHAN BULLER MRN: [id number] DOB: 1950/06/24 Today's Date: 11/06/2011 Time: 1450-1520 OT Time Calculation (min): 30 min  OT Assessment / Plan / Recommendation Comments on Treatment Session Good participation. Limited by pain. Good rehab candidate.    Follow Up Recommendations  Inpatient Rehab    Barriers to Discharge       Equipment Recommendations  Defer to next venue    Recommendations for Other Services Rehab consult  Frequency Min 2X/week   Plan Discharge plan remains appropriate    Precautions / Restrictions Restrictions Other Position/Activity Restrictions: WBAT   Pertinent Vitals/Pain 8. nsg aware    ADL  Upper Body Bathing: Performed;Moderate assistance Where Assessed - Upper Body Bathing: Unsupported sitting Lower Body Bathing: Performed;+1 Total assistance Where Assessed - Lower Body Bathing: Supported sit to stand Upper Body Dressing: Performed;Moderate assistance Where Assessed - Upper Body Dressing: Unsupported sitting Transfers/Ambulation Related to ADLs: +2 total A for safety. Pt only completed stand pivot transfer. c/o nausea/dizziness. nsg called and aware.    OT Diagnosis:    OT Problem List:   OT Treatment Interventions:     OT Goals Acute Rehab OT Goals OT Goal Formulation: With patient Time For Goal Achievement: 11/13/11 Potential to Achieve Goals: Good ADL Goals Pt Will Perform Grooming: with set-up;Sitting, edge of bed;Unsupported ADL Goal: Grooming - Progress: Progressing toward goals Pt Will Perform Upper Body Bathing: with set-up;Sitting, edge of bed;Unsupported ADL Goal: Upper Body Bathing - Progress: Progressing toward goals Pt Will Perform Upper Body Dressing: with set-up;Unsupported;Sitting, bed ADL Goal: Upper Body Dressing - Progress: Progressing toward goals Pt Will Transfer to Toilet: with mod assist;Ambulation;with DME;3-in-1 ADL Goal: Toilet Transfer - Progress: Progressing  toward goals Pt Will Perform Toileting - Clothing Manipulation: with min assist;Standing ADL Goal: Toileting - Clothing Manipulation - Progress: Progressing toward goals Pt Will Perform Toileting - Hygiene: Independently;Sit to stand from 3-in-1/toilet ADL Goal: Toileting - Hygiene - Progress: Progressing toward goals  Visit Information  Last OT Received On: 11/06/11    Subjective Data      Prior Functioning       Cognition  Cognition - Other Comments: Pt reports feeling confused or "hazy" at times    Mobility     Exercises    Balance    End of Session OT - End of Session Equipment Utilized During Treatment: Gait belt Activity Tolerance: Patient limited by pain Patient left: in chair;with call bell/phone within reach;with nursing in room Nurse Communication: Mobility status;Patient requests pain meds;Weight bearing status  GO     Anarosa Kubisiak,HILLARY 11/06/2011, 6:01 PM [facility], OTR/L  [phone] 11/06/2011

## 2011-11-06 NOTE — Progress Notes (Signed)
Pt refusing PT eval today due to pain.  MD increased pain meds.  Pt reporting, however, his pain is not going down.  Pt educated on importance of mobility.  He was instructed to independently perform ankle pumps, quad sets and glut sets ad lib.  PT to re-attempt tomorrow.  Aida Raider, PT  Office # [phone] Pager [phone]
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
## 2011-11-07 MED ORDER — DIPHENHYDRAMINE HCL 25 MG PO CAPS
25.0000 mg | ORAL_CAPSULE | Freq: Four times a day (QID) | ORAL | Status: DC | PRN
  Administered 2011-11-07 – 2011-11-10 (×7): 25 mg via ORAL
  Filled 2011-11-07 (×7): qty 1
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
## 2011-11-07 NOTE — Plan of Care (Signed)
Problem: Phase I Progression Outcomes Goal: Initial discharge plan identified Outcome: Progressing MD plans to place CIR consult next date     Problem: Phase II Progression Outcomes Goal: Progress activity as tolerated unless otherwise ordered Outcome: Progressing PT eval done, goals set, d/c recommendations placed
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
## 2011-11-08 ENCOUNTER — Inpatient Hospital Stay (HOSPITAL_COMMUNITY): Payer: Medicaid Other

## 2011-11-08 DIAGNOSIS — S329XXA Fracture of unspecified parts of lumbosacral spine and pelvis, initial encounter for closed fracture: Secondary | ICD-10-CM

## 2011-11-08 DIAGNOSIS — S32810A Multiple fractures of pelvis with stable disruption of pelvic ring, initial encounter for closed fracture: Secondary | ICD-10-CM

## 2011-11-08 MED ORDER — OXYCODONE HCL 5 MG PO TABS
10.0000 mg | ORAL_TABLET | ORAL | Status: DC | PRN
  Administered 2011-11-08: 20 mg via ORAL
  Administered 2011-11-08 (×2): 10 mg via ORAL
  Administered 2011-11-09: 15 mg via ORAL
  Administered 2011-11-09: 10 mg via ORAL
  Administered 2011-11-09: 20 mg via ORAL
  Administered 2011-11-11 (×2): 10 mg via ORAL
  Filled 2011-11-08: qty 3
  Filled 2011-11-08 (×3): qty 2
  Filled 2011-11-08: qty 4
  Filled 2011-11-08 (×4): qty 2

## 2011-11-08 NOTE — Progress Notes (Signed)
Patient would consider Rehab here.  This patient has been seen and I agree with the findings and treatment plan.  Marta Lamas. Gae Bon, MD, FACS [phone] (pager) [phone] (direct pager) Trauma Surgeon
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
## 2011-11-08 NOTE — Progress Notes (Signed)
Physical Therapy Treatment Note   11/08/11 0908  PT Visit Information  Last PT Received On 11/08/11  Assistance Needed +1  PT Time Calculation  PT Start Time 0908  PT Stop Time 0925  PT Time Calculation (min) 17 min  Subjective Data  Subjective Pt received supine in bed with c/o of back itching and 9/10 pain.  Precautions  Precautions Fall  Restrictions  RLE Weight Bearing WBAT  LLE Weight Bearing WBAT  Cognition  Overall Cognitive Status Appears within functional limits for tasks assessed/performed  Bed Mobility  Bed Mobility Supine to Sit;Sitting - Scoot to Edge of Bed  Supine to Sit 5: Supervision;With rails;HOB elevated  Sitting - Scoot to Edge of Bed 5: Supervision;With rail  Details for Bed Mobility Assistance requires significant increase in time, labored effort, attempted to assist patient with long sit however pt with increased pain and unable to complete  Transfers  Transfers Sit to Stand  Sit to Stand 3: Mod assist;From elevated surface;With upper extremity assist;From bed  Details for Transfer Assistance increased time require due to pain and labored effort  Ambulation/Gait  Ambulation/Gait Assistance 4: Min assist  Ambulation Distance (Feet) 20 Feet  Assistive device Rolling walker  Ambulation/Gait Assistance Details pt with labored effort due to increased pain, increased UE support, decreased LE WBing tolerance, extremely short step length  Gait Pattern Decreased stride length;Antalgic;Shuffle;Narrow base of support  PT - End of Session  Equipment Utilized During Treatment Gait belt  Activity Tolerance Patient limited by pain;Patient limited by fatigue;Patient tolerated treatment well  Patient left (left with OT at sink in bathroom)  PT - Assessment/Plan  Comments on Treatment Session Pt con't to have significant pain with all movement limiting OOB mobilty. Pt to con't to benefit from CIR to achieve maximal functional recovery prior to transition home  PT Plan  Discharge plan remains appropriate;Frequency remains appropriate  PT Frequency Min 5X/week  Recommendations for Other Services Rehab consult  Follow Up Recommendations Inpatient Rehab  Equipment Recommended Defer to next venue  Acute Rehab PT Goals  PT Goal: Supine/Side to Sit - Progress Progressing toward goal  PT Goal: Sit to Supine/Side - Progress Progressing toward goal  PT Goal: Sit to Stand - Progress Progressing toward goal  PT Goal: Stand to Sit - Progress Progressing toward goal  PT Goal: Ambulate - Progress Progressing toward goal  PT General Charges  $$ ACUTE PT VISIT 1 Procedure  PT Treatments  $Gait Training 8-22 mins     Pain: 9/10 pelvis pain  Lewis Shock, PT, DPT Pager #: [phone] Office #: [phone]
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
## 2011-11-08 NOTE — Progress Notes (Signed)
Occupational Therapy Treatment Patient Details Name: Dean [name] MRN: [id number] DOB: 1951-04-13 Today's Date: 11/08/2011 Time: 4098-1191 OT Time Calculation (min): 45 min  OT Assessment / Plan / Recommendation Comments on Treatment Session Improving. Limited by pain. Excellent rehab candidate. PT would like to participate in Rehab in [place] and then move home with brother when he is more mobilie and independent. He has local sisters for support.    Follow Up Recommendations  Inpatient Rehab    Barriers to Discharge       Equipment Recommendations  Defer to next venue    Recommendations for Other Services Rehab consult  Frequency Min 2X/week   Plan Discharge plan remains appropriate    Precautions / Restrictions Precautions Precautions: Fall Restrictions Weight Bearing Restrictions: No RLE Weight Bearing: Weight bearing as tolerated LLE Weight Bearing: Weight bearing as tolerated   Pertinent Vitals/Pain 6    ADL  Upper Body Bathing: Performed;Set up Where Assessed - Upper Body Bathing: Unsupported sitting Lower Body Bathing: Performed;Maximal assistance (for legs) Where Assessed - Lower Body Bathing: Supported sit to stand Upper Body Dressing: Performed;Set up Where Assessed - Upper Body Dressing: Supported standing Equipment Used: Gait belt;Rolling walker Transfers/Ambulation Related to ADLs: Mod A trsf. Min guard ambulation. able to walk to bathroom with RW very slowly. ADL Comments: Improving. Will benefit from AE.    OT Diagnosis:    OT Problem List:   OT Treatment Interventions:     OT Goals Acute Rehab OT Goals OT Goal Formulation: With patient Time For Goal Achievement: 11/13/11 Potential to Achieve Goals: Good ADL Goals Pt Will Perform Grooming: with set-up;Sitting, edge of bed;Unsupported ADL Goal: Grooming - Progress: Met Pt Will Perform Upper Body Bathing: with set-up;Sitting, edge of bed;Unsupported ADL Goal: Upper Body Bathing - Progress:  Met Pt Will Perform Upper Body Dressing: with set-up;Unsupported;Sitting, bed ADL Goal: Upper Body Dressing - Progress: Met Pt Will Transfer to Toilet: with mod assist;Ambulation;with DME;3-in-1 ADL Goal: Toilet Transfer - Progress: Met Pt Will Perform Toileting - Clothing Manipulation: with min assist;Standing ADL Goal: Toileting - Clothing Manipulation - Progress: Progressing toward goals Pt Will Perform Toileting - Hygiene: Independently;Sit to stand from 3-in-1/toilet ADL Goal: Toileting - Hygiene - Progress: Progressing toward goals  Visit Information  Last OT Received On: 11/08/11 Assistance Needed: +1    Subjective Data      Prior Functioning       Cognition  Overall Cognitive Status: Appears within functional limits for tasks assessed/performed    Mobility Bed Mobility Bed Mobility: Supine to Sit;Sitting - Scoot to Edge of Bed Supine to Sit: 5: Supervision;With rails;HOB elevated Supine to Sit: Patient Percentage: 80% Sitting - Scoot to Edge of Bed: 5: Supervision;With rail Details for Bed Mobility Assistance: requires significant increase in time, labored effort, attempted to assist patient with long sit however pt with increased pain and unable to complete Transfers Transfers: Sit to Stand;Stand to Sit Sit to Stand: 3: Mod assist;From elevated surface;With upper extremity assist;From bed Sit to Stand: Patient Percentage: 60% (Assist needed to transition to upright posture due to pain) Stand to Sit: 4: Min assist;To elevated surface;With upper extremity assist;To bed Stand to Sit: Patient Percentage: 90% Details for Transfer Assistance: increased time require due to pain and labored effort   Exercises    Balance    End of Session OT - End of Session Equipment Utilized During Treatment: Gait belt Activity Tolerance: Patient limited by pain Patient left: in bed;with call bell/phone within reach Nurse Communication: Mobility  status;Other (comment) (CIR appropriate)    GO     Anagabriela Jokerst,HILLARY 11/08/2011, 9:58 AM [facility], OTR/L  [phone] 11/08/2011

## 2011-11-09 MED ORDER — WHITE PETROLATUM GEL
Status: AC
  Filled 2011-11-09: qty 5

## 2011-11-09 MED ORDER — MORPHINE SULFATE ER 15 MG PO TBCR
15.0000 mg | EXTENDED_RELEASE_TABLET | Freq: Three times a day (TID) | ORAL | Status: DC
  Administered 2011-11-09 – 2011-11-11 (×6): 15 mg via ORAL
  Filled 2011-11-09 (×6): qty 1

## 2011-11-09 NOTE — Progress Notes (Signed)
Nutrition Follow-up  Intervention:   1.  General healthful diet; continue current management.  Encouraged intake.  Again discussed pt's increased needs for healing and explained the importance of nutrition.  Pt verbalizes understanding.  No other interventions warranted at this time.  Assessment:   Pt states intake has improved some. PO intake at meals 0-60%.  His pain is better controlled, but still limiting intake and participation in therapy.  RD notes pt plans for d/c to rehab soon- facility to be determined. Pt is drinking Ensure Complete, approximately 2 per day for additional 700 kcal, 26g protein.  Diet Order:  Regular  Meds: Scheduled Meds:   . cloNIDine  0.2 mg Oral BID  . docusate sodium  100 mg Oral Daily  . enoxaparin (LOVENOX) injection  40 mg Subcutaneous Q24H  . feeding supplement  237 mL Oral TID WC  . methocarbamol  1,000 mg Oral QID  . morphine  15 mg Oral Q8H  . traMADol  100 mg Oral Q6H  . white petrolatum       Continuous Infusions:  PRN Meds:.diphenhydrAMINE, morphine injection, oxyCODONE  Labs:  CMP     Component Value Date/Time   NA 136 11/05/2011 0625   K 3.7 11/05/2011 0625   CL 99 11/05/2011 0625   CO2 24 11/05/2011 0625   GLUCOSE 106* 11/05/2011 0625   BUN 20 11/05/2011 0625   CREATININE 1.05 11/05/2011 0625   CALCIUM 8.5 11/05/2011 0625   PROT 8.3 11/04/2011 1955   ALBUMIN 3.8 11/04/2011 1955   AST 46* 11/04/2011 1955   ALT 38 11/04/2011 1955   ALKPHOS 57 11/04/2011 1955   BILITOT 1.3* 11/04/2011 1955   GFRNONAA 75* 11/05/2011 0625   GFRAA 87* 11/05/2011 0625    No intake or output data in the 24 hours ending 11/09/11 1346  Weight Status:  No new wt. Admission wt: 157 lbs  Re-statement of needs: 2140-2500 kcal, 100-114g  Nutrition Dx:  Increased nutrient needs, ongoing  Monitor:   1.  Food/Beverage; improvement in intake as pain control improves.  Pt to consume at least 2 supplements daily.  Met, ongoing.   Loyce Dys, MS RD LDN Clinical  Inpatient Dietitian Pager: [phone] Weekend/After hours pager: [phone]

## 2011-11-09 NOTE — Progress Notes (Signed)
Physical Therapy Treatment Note   11/09/11 1314  PT Visit Information  Last PT Received On 11/09/11  Reason Eval/Treat Not Completed Patient refused. Attempted to see patient x2 (1030am 1314). Pt educated on importance of OOB mobility. Patient reports "I am in too much pain. I'm laying here in the bed today. I'll try tomorrow.  PT General Charges  $PT Canceled Visit 1 Procedure    Pain: 10/10 back pain  Lewis Shock, PT, DPT Pager #: [phone] Office #: [phone]
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
## 2011-11-09 NOTE — Progress Notes (Signed)
Ongoing pain. Information sent to alternate comprehensive inpatient rehabilitation center. Patient is aware of the plan. Patient examined and I agree with the assessment and plan  Violeta Gelinas, MD, MPH, FACS Pager: [phone]  11/09/2011 3:21 PM

## 2011-11-09 NOTE — Progress Notes (Signed)
Notified that pt wished to do his rehab close to where he was moving to be near his [place], [place].  Called [facility] re[facility] and left voicemail yesterday re: potential referral.  Still no return call yet today. After speaking with pt further, he asked for facilities close to [facility] or [place] areas.  Located [facility] on an internet listing of inpt rehab facilities. Called and spoke with Howell Rucks, their admissions coordinator ( p:[phone], f: [phone]) this am.  Faxed all requested clinical info to her at [phone].  She said they had bed availability and if accepted, he could transfer tomorrow.  Await approval/denial.

## 2011-11-10 DIAGNOSIS — D62 Acute posthemorrhagic anemia: Secondary | ICD-10-CM

## 2011-11-10 NOTE — Progress Notes (Signed)
Rehab admissions - Evaluated for possible admission.  I spoke with patient and [place].  Wants to go home with his twin brother after his rehab is complete.  [place]  Call me for questions.  #[phone]

## 2011-11-10 NOTE — Progress Notes (Signed)
OT Cancellation Note  Treatment cancelled today due to patient's refusal to participate.  [facility] Santino Kinsella, OTR/L  [phone] 7/17/20137/17/2013, 5:12 PM

## 2011-11-10 NOTE — Progress Notes (Signed)
[facility] rehab called noon yesterday--notified them that I had already sent referral to [facility] rehab in [place], [place].  Fax issues required it to be resent in afternoon yesterday.  No approval given yesterday and none received yet this am.  I have called and left a voicemail this am with admissions coordinator. Even if approval received today, pt would have to wait until tomorrow to transfer due to distance--pt has to arrive to facility in time to complete rehab on day one.   [facility] would do transport.  [phone]  UR updated.
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
## 2011-11-11 MED ORDER — DSS 100 MG PO CAPS: 100.0000 mg | ORAL_CAPSULE | Freq: Every day | ORAL | Status: AC

## 2011-11-11 MED ORDER — MORPHINE SULFATE ER 15 MG PO TBCR: 15.0000 mg | EXTENDED_RELEASE_TABLET | Freq: Three times a day (TID) | ORAL | Status: DC

## 2011-11-11 MED ORDER — CLONIDINE HCL 0.2 MG PO TABS: 0.2000 mg | ORAL_TABLET | Freq: Two times a day (BID) | ORAL | Status: DC

## 2011-11-11 MED ORDER — OXYCODONE HCL 10 MG PO TABS: 10.0000 mg | ORAL_TABLET | ORAL | Status: AC | PRN

## 2011-11-11 MED ORDER — ENOXAPARIN SODIUM 40 MG/0.4ML ~~LOC~~ SOLN: 40.0000 mg | SUBCUTANEOUS | Status: DC

## 2011-11-11 MED ORDER — METHOCARBAMOL 500 MG PO TABS: 1000.0000 mg | ORAL_TABLET | Freq: Four times a day (QID) | ORAL | Status: AC

## 2011-11-11 MED ORDER — ENSURE COMPLETE PO LIQD: 237.0000 mL | Freq: Three times a day (TID) | ORAL | Status: DC

## 2011-11-11 MED ORDER — TRAMADOL HCL 50 MG PO TABS: 100.0000 mg | ORAL_TABLET | Freq: Four times a day (QID) | ORAL | Status: AC

## 2011-11-11 NOTE — Progress Notes (Signed)
DC orders received.  Patient stable with no S/S of distress other than obvious pelvic pain related to fractures.  Discharge instructions and medications reviewed with patient.  Report called and given to Ok Edwards.  Patient DC via [facility]. Nolon Nations

## 2011-11-11 NOTE — Discharge Summary (Signed)
Bee Hammerschmidt, MD, MPH, FACS Pager: [phone]  
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
## 2011-11-11 NOTE — Progress Notes (Signed)
Agree Cayetano Mikita, MD, MPH, FACS Pager: [phone]  
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
## 2016-01-16 ENCOUNTER — Emergency Department (HOSPITAL_COMMUNITY)
Admission: EM | Admit: 2016-01-16 | Discharge: 2016-01-16 | Disposition: A | Discharge status: Home / Self Care | Payer: Medicaid Other | Attending: Emergency Medicine | Admitting: Emergency Medicine

## 2016-01-16 ENCOUNTER — Encounter (HOSPITAL_COMMUNITY): Payer: Self-pay

## 2016-01-16 DIAGNOSIS — S0121XA Laceration without foreign body of nose, initial encounter: Secondary | ICD-10-CM | POA: Insufficient documentation

## 2016-01-16 DIAGNOSIS — Z5321 Procedure and treatment not carried out due to patient leaving prior to being seen by health care provider: Secondary | ICD-10-CM | POA: Insufficient documentation

## 2016-01-16 DIAGNOSIS — Y999 Unspecified external cause status: Secondary | ICD-10-CM | POA: Diagnosis not present

## 2016-01-16 DIAGNOSIS — W01198A Fall on same level from slipping, tripping and stumbling with subsequent striking against other object, initial encounter: Secondary | ICD-10-CM | POA: Insufficient documentation

## 2016-01-16 DIAGNOSIS — I10 Essential (primary) hypertension: Secondary | ICD-10-CM | POA: Diagnosis not present

## 2016-01-16 DIAGNOSIS — Y92009 Unspecified place in unspecified non-institutional (private) residence as the place of occurrence of the external cause: Secondary | ICD-10-CM | POA: Insufficient documentation

## 2016-01-16 DIAGNOSIS — F17210 Nicotine dependence, cigarettes, uncomplicated: Secondary | ICD-10-CM | POA: Insufficient documentation

## 2016-01-16 DIAGNOSIS — Y939 Activity, unspecified: Secondary | ICD-10-CM | POA: Diagnosis not present

## 2016-01-16 NOTE — ED Notes (Signed)
Pt left AMA. Stated that he was tired of waiting on a provider

## 2016-01-16 NOTE — ED Notes (Signed)
Bed: [facility] Expected date:  Expected time:  Means of arrival:  Comments: No bed. 
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
## 2016-01-17 ENCOUNTER — Emergency Department (HOSPITAL_COMMUNITY)
Admission: EM | Admit: 2016-01-17 | Discharge: 2016-01-18 | Disposition: A | Discharge status: Home / Self Care | Payer: Medicare Other | Attending: Emergency Medicine | Admitting: Emergency Medicine

## 2016-01-17 ENCOUNTER — Encounter (HOSPITAL_COMMUNITY): Payer: Self-pay

## 2016-01-17 DIAGNOSIS — Y939 Activity, unspecified: Secondary | ICD-10-CM | POA: Diagnosis not present

## 2016-01-17 DIAGNOSIS — W010XXA Fall on same level from slipping, tripping and stumbling without subsequent striking against object, initial encounter: Secondary | ICD-10-CM | POA: Diagnosis not present

## 2016-01-17 DIAGNOSIS — Z79899 Other long term (current) drug therapy: Secondary | ICD-10-CM | POA: Insufficient documentation

## 2016-01-17 DIAGNOSIS — Y929 Unspecified place or not applicable: Secondary | ICD-10-CM | POA: Insufficient documentation

## 2016-01-17 DIAGNOSIS — S9032XA Contusion of left foot, initial encounter: Secondary | ICD-10-CM | POA: Diagnosis not present

## 2016-01-17 DIAGNOSIS — S022XXB Fracture of nasal bones, initial encounter for open fracture: Secondary | ICD-10-CM | POA: Insufficient documentation

## 2016-01-17 DIAGNOSIS — S0181XA Laceration without foreign body of other part of head, initial encounter: Secondary | ICD-10-CM | POA: Diagnosis not present

## 2016-01-17 DIAGNOSIS — I10 Essential (primary) hypertension: Secondary | ICD-10-CM | POA: Insufficient documentation

## 2016-01-17 DIAGNOSIS — F17210 Nicotine dependence, cigarettes, uncomplicated: Secondary | ICD-10-CM | POA: Diagnosis not present

## 2016-01-17 DIAGNOSIS — S0083XA Contusion of other part of head, initial encounter: Secondary | ICD-10-CM

## 2016-01-17 DIAGNOSIS — Y999 Unspecified external cause status: Secondary | ICD-10-CM | POA: Insufficient documentation

## 2016-01-17 DIAGNOSIS — S0992XA Unspecified injury of nose, initial encounter: Secondary | ICD-10-CM | POA: Diagnosis present
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
## 2016-01-18 ENCOUNTER — Emergency Department (HOSPITAL_COMMUNITY): Payer: Medicare Other

## 2016-01-18 DIAGNOSIS — S022XXB Fracture of nasal bones, initial encounter for open fracture: Secondary | ICD-10-CM | POA: Diagnosis not present

## 2016-01-18 MED ORDER — CEPHALEXIN 500 MG PO CAPS: 500.0000 mg | ORAL_CAPSULE | Freq: Four times a day (QID) | ORAL | 0 refills | Status: DC

## 2016-01-18 MED ORDER — CEPHALEXIN 500 MG PO CAPS
500.0000 mg | ORAL_CAPSULE | Freq: Once | ORAL | Status: AC
  Administered 2016-01-18: 500 mg via ORAL
  Filled 2016-01-18: qty 1

## 2016-01-18 MED ORDER — BACITRACIN ZINC 500 UNIT/GM EX OINT
TOPICAL_OINTMENT | Freq: Two times a day (BID) | CUTANEOUS | Status: DC
  Administered 2016-01-18: 1 via TOPICAL
  Filled 2016-01-18: qty 0.9

## 2016-01-18 MED ORDER — IBUPROFEN 200 MG PO TABS
400.0000 mg | ORAL_TABLET | Freq: Once | ORAL | Status: DC
  Filled 2016-01-18: qty 2
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
## 2016-01-18 NOTE — Discharge Instructions (Signed)
It was our pleasure to provide your ER care today - we hope that you feel better.  Keep your wounds very clean - wash with warm water and soap 2x/day.   Take tylenol and/or motrin as need for pain.  Take keflex (antibiotic) as prescribed.  For nasal fracture, follow up with ENT doctor in 1 week - see referral - call office to arrange appointment.   Your blood pressure is high - continue blood pressure medication, and follow up with primary care doctor in 1 week.   Return to ER if worse, new symptoms, new or severe pain, other medical emergency.

## 2016-11-27 ENCOUNTER — Emergency Department (HOSPITAL_COMMUNITY): Payer: Medicare Other

## 2016-11-27 ENCOUNTER — Encounter (HOSPITAL_COMMUNITY): Payer: Self-pay

## 2016-11-27 ENCOUNTER — Emergency Department (HOSPITAL_COMMUNITY)
Admission: EM | Admit: 2016-11-27 | Discharge: 2016-11-27 | Disposition: A | Discharge status: Home / Self Care | Payer: Medicare Other | Attending: Emergency Medicine | Admitting: Emergency Medicine

## 2016-11-27 DIAGNOSIS — R55 Syncope and collapse: Secondary | ICD-10-CM | POA: Diagnosis present

## 2016-11-27 DIAGNOSIS — Z79899 Other long term (current) drug therapy: Secondary | ICD-10-CM | POA: Diagnosis not present

## 2016-11-27 DIAGNOSIS — F17210 Nicotine dependence, cigarettes, uncomplicated: Secondary | ICD-10-CM | POA: Insufficient documentation

## 2016-11-27 DIAGNOSIS — F1910 Other psychoactive substance abuse, uncomplicated: Secondary | ICD-10-CM

## 2016-11-27 DIAGNOSIS — I10 Essential (primary) hypertension: Secondary | ICD-10-CM | POA: Diagnosis not present

## 2016-11-27 DIAGNOSIS — R569 Unspecified convulsions: Secondary | ICD-10-CM | POA: Diagnosis not present

## 2016-11-27 LAB — URINALYSIS, ROUTINE W REFLEX MICROSCOPIC
Bilirubin Urine: NEGATIVE
Glucose, UA: NEGATIVE mg/dL
HGB URINE DIPSTICK: NEGATIVE
Ketones, ur: NEGATIVE mg/dL
Nitrite: NEGATIVE
PH: 6 (ref 5.0–8.0)
Protein, ur: NEGATIVE mg/dL
SPECIFIC GRAVITY, URINE: 1.025 (ref 1.005–1.030)

## 2016-11-27 LAB — I-STAT TROPONIN, ED
Troponin i, poc: 0.02 ng/mL (ref 0.00–0.08)
Troponin i, poc: 0 ng/mL (ref 0.00–0.08)

## 2016-11-27 LAB — CBC
HCT: 40.3 % (ref 39.0–52.0)
HEMOGLOBIN: 14.3 g/dL (ref 13.0–17.0)
MCH: 32.6 pg (ref 26.0–34.0)
MCHC: 35.5 g/dL (ref 30.0–36.0)
MCV: 91.8 fL (ref 78.0–100.0)
Platelets: 152 10*3/uL (ref 150–400)
RBC: 4.39 MIL/uL (ref 4.22–5.81)
RDW: 12.5 % (ref 11.5–15.5)
WBC: 3.8 10*3/uL — ABNORMAL LOW (ref 4.0–10.5)

## 2016-11-27 LAB — BASIC METABOLIC PANEL
Anion gap: 10 (ref 5–15)
BUN: 19 mg/dL (ref 6–20)
CALCIUM: 9.1 mg/dL (ref 8.9–10.3)
CO2: 24 mmol/L (ref 22–32)
Chloride: 105 mmol/L (ref 101–111)
Creatinine, Ser: 1.4 mg/dL — ABNORMAL HIGH (ref 0.61–1.24)
GFR calc Af Amer: 59 mL/min — ABNORMAL LOW (ref >60)
GFR calc non Af Amer: 51 mL/min — ABNORMAL LOW (ref >60)
GLUCOSE: 80 mg/dL (ref 65–99)
Potassium: 3.7 mmol/L (ref 3.5–5.1)
SODIUM: 139 mmol/L (ref 135–145)

## 2016-11-27 LAB — RAPID URINE DRUG SCREEN, HOSP PERFORMED
AMPHETAMINES: NOT DETECTED
BENZODIAZEPINES: NOT DETECTED
Barbiturates: NOT DETECTED
Cocaine: POSITIVE — AB
OPIATES: NOT DETECTED
TETRAHYDROCANNABINOL: POSITIVE — AB

## 2016-11-27 LAB — CK: Total CK: 155 U/L (ref 49–397)

## 2016-11-27 LAB — URINALYSIS, MICROSCOPIC (REFLEX)

## 2016-11-27 LAB — ETHANOL: Alcohol, Ethyl (B): 7 mg/dL — ABNORMAL HIGH (ref <5)

## 2016-11-27 LAB — CBG MONITORING, ED: GLUCOSE-CAPILLARY: 76 mg/dL (ref 65–99)

## 2016-11-27 NOTE — ED Notes (Signed)
Pt informed of the need of a urine sample. Pt stated that he has no need and is unable to provide.
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
## 2016-11-27 NOTE — ED Triage Notes (Signed)
To room via EMS.  Pt was sitting on front porch smoking marijuana and drinking licquor, pt felt hot and passed out.  Unknown time out.  When pt woke up he reports dizziness.  EMS BP sitting 137/86 HR 82, standing BP 98/66, HR 107.

## 2016-11-27 NOTE — ED Notes (Signed)
Pt departed in NAD, refused use of wheelchair.  

## 2016-11-27 NOTE — ED Notes (Signed)
Pt unable to provide urine sample.  Requesting food.  Per Dr. Cyndy Freeze to give food and to wait on urine sample.

## 2016-11-27 NOTE — Discharge Instructions (Signed)
1. You must have a family doctor. Use a referral number in her discharge instructions to find one. 2. It appears likely that a seizure from a combination of alcohol and cocaine and marijuana. He must abstain from use of these substances. There is a referral guide in your discharge instructions for treatment of substance abuse. 3. Return to the emergency department if you have a another episode of loss of consciousness, seizure-like activity, chest pain, shortness of breath or any other concerning symptoms.

## 2017-06-08 ENCOUNTER — Encounter (HOSPITAL_COMMUNITY): Payer: Self-pay | Admitting: Emergency Medicine

## 2017-06-08 ENCOUNTER — Emergency Department (HOSPITAL_COMMUNITY): Payer: Medicare HMO

## 2017-06-08 DIAGNOSIS — Y999 Unspecified external cause status: Secondary | ICD-10-CM | POA: Diagnosis not present

## 2017-06-08 DIAGNOSIS — Y92512 Supermarket, store or market as the place of occurrence of the external cause: Secondary | ICD-10-CM | POA: Diagnosis not present

## 2017-06-08 DIAGNOSIS — Y939 Activity, unspecified: Secondary | ICD-10-CM | POA: Diagnosis not present

## 2017-06-08 DIAGNOSIS — W01198A Fall on same level from slipping, tripping and stumbling with subsequent striking against other object, initial encounter: Secondary | ICD-10-CM | POA: Diagnosis not present

## 2017-06-08 DIAGNOSIS — R509 Fever, unspecified: Secondary | ICD-10-CM | POA: Diagnosis not present

## 2017-06-08 DIAGNOSIS — R0789 Other chest pain: Secondary | ICD-10-CM | POA: Insufficient documentation

## 2017-06-08 DIAGNOSIS — I10 Essential (primary) hypertension: Secondary | ICD-10-CM | POA: Diagnosis not present

## 2017-06-08 DIAGNOSIS — S0990XA Unspecified injury of head, initial encounter: Secondary | ICD-10-CM | POA: Insufficient documentation

## 2017-06-08 DIAGNOSIS — F17210 Nicotine dependence, cigarettes, uncomplicated: Secondary | ICD-10-CM | POA: Insufficient documentation

## 2017-06-08 LAB — CBC
HEMATOCRIT: 46.8 % (ref 39.0–52.0)
HEMOGLOBIN: 16.2 g/dL (ref 13.0–17.0)
MCH: 33.9 pg (ref 26.0–34.0)
MCHC: 34.6 g/dL (ref 30.0–36.0)
MCV: 97.9 fL (ref 78.0–100.0)
Platelets: 157 10*3/uL (ref 150–400)
RBC: 4.78 MIL/uL (ref 4.22–5.81)
RDW: 13.3 % (ref 11.5–15.5)
WBC: 6.2 10*3/uL (ref 4.0–10.5)

## 2017-06-08 LAB — I-STAT TROPONIN, ED: TROPONIN I, POC: 0 ng/mL (ref 0.00–0.08)

## 2017-06-08 LAB — BASIC METABOLIC PANEL
ANION GAP: 15 (ref 5–15)
BUN: 16 mg/dL (ref 6–20)
CALCIUM: 9.2 mg/dL (ref 8.9–10.3)
CO2: 23 mmol/L (ref 22–32)
Chloride: 96 mmol/L — ABNORMAL LOW (ref 101–111)
Creatinine, Ser: 1.18 mg/dL (ref 0.61–1.24)
GLUCOSE: 112 mg/dL — AB (ref 65–99)
POTASSIUM: 3.5 mmol/L (ref 3.5–5.1)
SODIUM: 134 mmol/L — AB (ref 135–145)

## 2017-06-08 NOTE — ED Triage Notes (Signed)
Pt presents with CP and SOB since fall on Sunday where confirms he lost consciousness; pt states he did not seek care after this fall; pt reporting increased pain to the L rib with deep breathing; pt also presents with high BP and states he ran out of his BP medications yesterday; pt was seen at [facility] just PTA to [facility] and niece at chairside stated that they left d/t 7.5 hr wait and she did not think that was appropriate for her uncle; pt also stated to the EMT during triage here at [facility] that "I will cussed every one of you out if you send [place] back out to the waiting room"

## 2017-06-09 ENCOUNTER — Emergency Department (HOSPITAL_COMMUNITY): Payer: Medicare HMO

## 2017-06-09 ENCOUNTER — Emergency Department (HOSPITAL_COMMUNITY)
Admission: EM | Admit: 2017-06-09 | Discharge: 2017-06-09 | Disposition: A | Discharge status: Home / Self Care | Payer: Medicare HMO | Attending: Emergency Medicine | Admitting: Emergency Medicine

## 2017-06-09 DIAGNOSIS — S0990XA Unspecified injury of head, initial encounter: Secondary | ICD-10-CM

## 2017-06-09 DIAGNOSIS — R0789 Other chest pain: Secondary | ICD-10-CM

## 2017-06-09 DIAGNOSIS — R509 Fever, unspecified: Secondary | ICD-10-CM

## 2017-06-09 DIAGNOSIS — I10 Essential (primary) hypertension: Secondary | ICD-10-CM

## 2017-06-09 LAB — I-STAT TROPONIN, ED: TROPONIN I, POC: 0.01 ng/mL (ref 0.00–0.08)

## 2017-06-09 MED ORDER — CLONIDINE HCL 0.2 MG PO TABS
0.2000 mg | ORAL_TABLET | Freq: Once | ORAL | Status: AC
  Administered 2017-06-09: 0.2 mg via ORAL
  Filled 2017-06-09: qty 1

## 2017-06-09 MED ORDER — CLONIDINE HCL 0.2 MG PO TABS: 0.2000 mg | ORAL_TABLET | Freq: Two times a day (BID) | ORAL | 1 refills | Status: DC

## 2017-06-09 MED ORDER — MORPHINE SULFATE (PF) 4 MG/ML IV SOLN
4.0000 mg | Freq: Once | INTRAVENOUS | Status: AC
  Administered 2017-06-09: 4 mg via INTRAMUSCULAR
  Filled 2017-06-09: qty 1

## 2017-06-09 MED ORDER — ACETAMINOPHEN 500 MG PO TABS
1000.0000 mg | ORAL_TABLET | Freq: Once | ORAL | Status: AC
  Administered 2017-06-09: 1000 mg via ORAL
  Filled 2017-06-09: qty 2

## 2017-06-09 NOTE — ED Provider Notes (Signed)
TIME SEEN: 4:21 AM  CHIEF COMPLAINT: Chest pain, head injury, hypertension  HPI: Patient is a 67 year old male with history of hypertension who presents to the emergency department with multiple complaints.  States that 4 days ago he tripped and fell in a convenience store and struck his head.  Denies loss of consciousness.  Has a large bump to his right forehead.  Also states that he hit his left anterior chest wall and has been having pain in that area since.  States pain is worse with movement.  No history of CAD.  No numbness, tingling or focal weakness.  No neck or back pain.  No abdominal pain.  Patient also complaining of hypertension.  Has been out of his clonidine for the past several days.  No vision changes.  Found to be febrile in the emergency department.  He denies any recent fevers.  No cough, sore throat, body aches, vomiting or diarrhea.  Did not have an influenza vaccination.   ROS: See HPI Constitutional:  fever  Eyes: no drainage  ENT: no runny nose   Cardiovascular:   chest pain  Resp: no SOB  GI: no vomiting GU: no dysuria Integumentary: no rash  Allergy: no hives  Musculoskeletal: no leg swelling  Neurological: no slurred speech ROS otherwise negative  PAST MEDICAL HISTORY/PAST SURGICAL HISTORY:  Past Medical History:  Diagnosis Date  . Hypertension     MEDICATIONS:  Prior to Admission medications   Medication Sig Start Date End Date Taking? Authorizing Provider  cephALEXin (KEFLEX) 500 MG capsule Take 1 capsule (500 mg total) by mouth 4 (four) times daily. 01/18/16   [name], MD  cloNIDine (CATAPRES) 0.2 MG tablet Take 1 tablet (0.2 mg total) by mouth 2 (two) times daily. 11/11/11 11/10/12  Lisette Abu, PA-C  enoxaparin (LOVENOX) 40 MG/0.4ML injection Inject 0.4 mLs (40 mg total) into the skin daily. 11/11/11   Lisette Abu, PA-C  feeding supplement (ENSURE COMPLETE) LIQD Take 237 mLs by mouth 3 (three) times daily with meals. 11/11/11   Lisette Abu, PA-C  morphine (MS CONTIN) 15 MG 12 hr tablet Take 1 tablet (15 mg total) by mouth every 8 (eight) hours. 11/11/11   Lisette Abu, PA-C    ALLERGIES:  Allergies  Allergen Reactions  . Penicillins Nausea Only and Swelling  . Tetanus Toxoids Nausea Only and Swelling    SOCIAL HISTORY:  Social History   Tobacco Use  . Smoking status: Current Some Day Smoker    Packs/day: 1.00    Types: Cigarettes  . Smokeless tobacco: Never Used  Substance Use Topics  . Alcohol use: Yes    Comment: "every now and then"    FAMILY HISTORY: History reviewed. No pertinent family history.  EXAM: BP (!) 162/135   Pulse 96   Temp (!) 100.8 F (38.2 C) (Oral)   Resp 15   Ht 5' 9.5" (1.765 m)   Wt 72.6 kg (160 lb)   SpO2 98%   BMI 23.29 kg/m  CONSTITUTIONAL: Alert and oriented and responds appropriately to questions. Well-appearing; well-nourished; GCS 15 HEAD: Normocephalic; hematoma to the right forehead EYES: Conjunctivae clear, PERRL, EOMI ENT: normal nose; no rhinorrhea; moist mucous membranes; pharynx without lesions noted; no dental injury; no septal hematoma NECK: Supple, no meningismus, no LAD; no midline spinal tenderness, step-off or deformity; trachea midline CARD: RRR; S1 and S2 appreciated; no murmurs, no clicks, no rubs, no gallops RESP: Normal chest excursion without splinting or tachypnea; breath sounds  clear and equal bilaterally; no wheezes, no rhonchi, no rales; no hypoxia or respiratory distress CHEST:  chest wall stable, no crepitus or ecchymosis or deformity, tender to palpation over the left anterior chest wall which reproduces his pain n; no flail chest ABD/GI: Normal bowel sounds; non-distended; soft, non-tender, no rebound, no guarding; no ecchymosis or other lesions noted PELVIS:  stable, nontender to palpation BACK:  The back appears normal and is non-tender to palpation, there is no CVA tenderness; no midline spinal tenderness, step-off or  deformity EXT: Normal ROM in all joints; non-tender to palpation; no edema; normal capillary refill; no cyanosis, no bony tenderness or bony deformity of patient's extremities, no joint effusion, compartments are soft, extremities are warm and well-perfused, no ecchymosis SKIN: Normal color for age and race; warm NEURO: Moves all extremities equally, sensation to light touch intact diffusely, cranial nerves II through XII intact, normal speech PSYCH: The patient's mood and manner are appropriate. Grooming and personal hygiene are appropriate.  MEDICAL DECISION MAKING: Patient here with mechanical fall.  Does have a head injury and a chest wall injury.  X-ray showed no rib fractures, edema, pneumothorax or pneumonia.  Will obtain CT of his head.  No other sign of trauma on exam.  Will give 1 dose of morphine here.  Also very hypertensive.  Will give his home clonidine and reassess.  No signs of endorgan damage at this time.  I do not think that his chest pain is from ACS, PE or dissection.  It seems musculoskeletal in nature and related to his fall.  We will however check a second troponin as he does have risk factors.  ED PROGRESS: Patient has had 2 negative sets of cardiac enzymes.  Otherwise labs are unremarkable.  EKG shows no ischemic changes.  CT of the head is normal.  Patient's blood pressure has improved significantly and his pain has resolved.  Did have a low-grade fever here but no source of infection currently.  Does not appear septic or have signs of meningitis.  Again doubt pneumonia.  Doubt bacteremia.  No urinary symptoms.  Abdominal exam benign without vomiting discussed with him this could be the beginning of a viral illness and even the possible flu.  Discussed supportive care instructions and recommended alternating Tylenol and Motrin.  Patient verbalized understanding and is comfortable with this plan.  Given outpatient follow-up.   At this time, I do not feel there is any  life-threatening condition present. I have reviewed and discussed all results (EKG, imaging, lab, urine as appropriate) and exam findings with patient/family. I have reviewed nursing notes and appropriate previous records.  I feel the patient is safe to be discharged home without further emergent workup and can continue workup as an outpatient as needed. Discussed usual and customary return precautions. Patient/family verbalize understanding and are comfortable with this plan.  Outpatient follow-up has been provided if needed. All questions have been answered.      EKG Interpretation  Date/Time:  Wednesday June 08 2017 20:44:28 EST Ventricular Rate:  88 PR Interval:  138 QRS Duration: 72 QT Interval:  360 QTC Calculation: 435 R Axis:   2 Text Interpretation:  Normal sinus rhythm Possible Left atrial enlargement Borderline ECG No significant change since last tracing Confirmed by Jashawn Floyd, Cyril Mourning [phone]) on 06/09/2017 3:35:20 AM         Kiya Eno, Delice Bison, DO 06/09/17 [id number]

## 2017-06-09 NOTE — Discharge Instructions (Signed)
You may alternate Tylenol 1000 mg every 6 hours as needed for pain, fever and Ibuprofen 800 mg every 8 hours as needed for pain, fever.  Please take Ibuprofen with food.   To find a primary care or specialty doctor please call [phone] or [phone] to access "[facility] a Doctor Service."  You may also go on the [facility] website at [email or web address]  There are also multiple [facility], [facility], [facility] and [facility] practices throughout the [place] that are frequently accepting new patients. You may find a clinic that is close to your home and contact them.  [place] [address] [place]  [place] [address] [address] [place] [phone]

## 2017-09-23 ENCOUNTER — Emergency Department (HOSPITAL_COMMUNITY)
Admission: EM | Admit: 2017-09-23 | Discharge: 2017-09-23 | Disposition: A | Discharge status: Home / Self Care | Payer: Medicare HMO | Attending: Emergency Medicine | Admitting: Emergency Medicine

## 2017-09-23 ENCOUNTER — Encounter (HOSPITAL_COMMUNITY): Payer: Self-pay

## 2017-09-23 ENCOUNTER — Other Ambulatory Visit: Payer: Self-pay

## 2017-09-23 DIAGNOSIS — I10 Essential (primary) hypertension: Secondary | ICD-10-CM | POA: Diagnosis present

## 2017-09-23 DIAGNOSIS — Z5321 Procedure and treatment not carried out due to patient leaving prior to being seen by health care provider: Secondary | ICD-10-CM | POA: Insufficient documentation

## 2017-09-23 NOTE — ED Notes (Signed)
Pt did not answer for re-assessment.

## 2017-09-23 NOTE — ED Triage Notes (Signed)
Patient reports that he has been out of his BP medication x 3 months. Denies pain and refuses any labs and/or EKG. Alert and oriented, NAD

## 2017-09-23 NOTE — ED Notes (Signed)
Pt refused to have an EKG done, triage RN notified. Pt was informed by this tech that because he is complaining of chest pain, we get an EKG on those pt's. Pt stated that he only wants his BP medication refilled.

## 2017-09-23 NOTE — ED Notes (Signed)
Called for vitals x3 no answer °

## 2017-09-26 NOTE — ED Notes (Signed)
Follow up call made  No answer  09/26/17  0930  s Berlynn Warsame rn

## 2019-08-16 ENCOUNTER — Encounter (HOSPITAL_COMMUNITY): Payer: Self-pay

## 2019-08-16 ENCOUNTER — Emergency Department (HOSPITAL_COMMUNITY): Payer: 59

## 2019-08-16 ENCOUNTER — Other Ambulatory Visit: Payer: Self-pay

## 2019-08-16 ENCOUNTER — Emergency Department (HOSPITAL_COMMUNITY)
Admission: EM | Admit: 2019-08-16 | Discharge: 2019-08-16 | Disposition: A | Discharge status: Home / Self Care | Payer: 59 | Attending: Emergency Medicine | Admitting: Emergency Medicine

## 2019-08-16 DIAGNOSIS — R079 Chest pain, unspecified: Secondary | ICD-10-CM | POA: Diagnosis present

## 2019-08-16 DIAGNOSIS — Z79899 Other long term (current) drug therapy: Secondary | ICD-10-CM | POA: Diagnosis not present

## 2019-08-16 DIAGNOSIS — Z7982 Long term (current) use of aspirin: Secondary | ICD-10-CM | POA: Insufficient documentation

## 2019-08-16 DIAGNOSIS — F17210 Nicotine dependence, cigarettes, uncomplicated: Secondary | ICD-10-CM | POA: Diagnosis not present

## 2019-08-16 DIAGNOSIS — I10 Essential (primary) hypertension: Secondary | ICD-10-CM | POA: Insufficient documentation

## 2019-08-16 LAB — CBC WITH DIFFERENTIAL/PLATELET
Abs Immature Granulocytes: 0.03 10*3/uL (ref 0.00–0.07)
Basophils Absolute: 0 10*3/uL (ref 0.0–0.1)
Basophils Relative: 1 %
Eosinophils Absolute: 0.1 10*3/uL (ref 0.0–0.5)
Eosinophils Relative: 1 %
HCT: 35.1 % — ABNORMAL LOW (ref 39.0–52.0)
Hemoglobin: 11.3 g/dL — ABNORMAL LOW (ref 13.0–17.0)
Immature Granulocytes: 1 %
Lymphocytes Relative: 39 %
Lymphs Abs: 2.1 10*3/uL (ref 0.7–4.0)
MCH: 32.8 pg (ref 26.0–34.0)
MCHC: 32.2 g/dL (ref 30.0–36.0)
MCV: 102 fL — ABNORMAL HIGH (ref 80.0–100.0)
Monocytes Absolute: 0.5 10*3/uL (ref 0.1–1.0)
Monocytes Relative: 10 %
Neutro Abs: 2.6 10*3/uL (ref 1.7–7.7)
Neutrophils Relative %: 48 %
Platelets: 143 10*3/uL — ABNORMAL LOW (ref 150–400)
RBC: 3.44 MIL/uL — ABNORMAL LOW (ref 4.22–5.81)
RDW: 12.7 % (ref 11.5–15.5)
WBC: 5.3 10*3/uL (ref 4.0–10.5)
nRBC: 0 % (ref 0.0–0.2)

## 2019-08-16 LAB — TROPONIN I (HIGH SENSITIVITY)
Troponin I (High Sensitivity): 8 ng/L (ref <18)
Troponin I (High Sensitivity): 11 ng/L (ref <18)

## 2019-08-16 LAB — BASIC METABOLIC PANEL
Anion gap: 7 (ref 5–15)
BUN: 14 mg/dL (ref 8–23)
CO2: 24 mmol/L (ref 22–32)
Calcium: 8 mg/dL — ABNORMAL LOW (ref 8.9–10.3)
Chloride: 107 mmol/L (ref 98–111)
Creatinine, Ser: 0.98 mg/dL (ref 0.61–1.24)
GFR calc Af Amer: >60 mL/min (ref >60)
GFR calc non Af Amer: >60 mL/min (ref >60)
Glucose, Bld: 88 mg/dL (ref 70–99)
Potassium: 4.8 mmol/L (ref 3.5–5.1)
Sodium: 138 mmol/L (ref 135–145)

## 2019-08-16 NOTE — ED Triage Notes (Signed)
Pt BIB [facility] from home w/ c/o L sided chest pain starting yesterday 2300, hx of a "small heart attack". Pain is tight, sharp, radiating to neck, responsive to nirto, 324 ASA given by EMS. Pt hx prostate cancer. VS stable, pt A&Ox4, NAD noted.

## 2019-08-16 NOTE — Discharge Instructions (Signed)
Our cardiology office (heart doctors) should CALL YOU tomorrow or Monday to set up a rapid office follow up visit.  It is important that you are seen within a week.  Although your workup in the ER was reassuring today, we cannot definitively rule out heart disease.  You need to follow up with a specialist.  If your pain returns, worsens, or causes lightheadedness or difficulty breathing, you should return immediately to the ER.  Continue taking all of your other medications as prescribed.  Please make every effort to stop smoking.  This can lead to heart disease, lung problems, cancer, and death.

## 2019-08-16 NOTE — ED Notes (Signed)
Successful phlebotomy stick at the anterior side of L hand.
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
## 2019-08-23 ENCOUNTER — Ambulatory Visit: Payer: 59 | Admitting: Cardiology

## 2019-12-28 ENCOUNTER — Emergency Department (HOSPITAL_COMMUNITY)
Admission: EM | Admit: 2019-12-28 | Discharge: 2019-12-29 | Disposition: A | Discharge status: Home / Self Care | Payer: 59 | Attending: Emergency Medicine | Admitting: Emergency Medicine

## 2019-12-28 ENCOUNTER — Other Ambulatory Visit: Payer: Self-pay

## 2019-12-28 ENCOUNTER — Encounter (HOSPITAL_COMMUNITY): Payer: Self-pay | Admitting: Emergency Medicine

## 2019-12-28 DIAGNOSIS — R339 Retention of urine, unspecified: Secondary | ICD-10-CM | POA: Diagnosis not present

## 2019-12-28 DIAGNOSIS — Z79899 Other long term (current) drug therapy: Secondary | ICD-10-CM | POA: Insufficient documentation

## 2019-12-28 DIAGNOSIS — R3912 Poor urinary stream: Secondary | ICD-10-CM | POA: Diagnosis present

## 2019-12-28 DIAGNOSIS — Z7982 Long term (current) use of aspirin: Secondary | ICD-10-CM | POA: Diagnosis not present

## 2019-12-28 DIAGNOSIS — C61 Malignant neoplasm of prostate: Secondary | ICD-10-CM | POA: Diagnosis not present

## 2019-12-28 DIAGNOSIS — I10 Essential (primary) hypertension: Secondary | ICD-10-CM | POA: Diagnosis not present

## 2019-12-28 DIAGNOSIS — F17210 Nicotine dependence, cigarettes, uncomplicated: Secondary | ICD-10-CM | POA: Diagnosis not present

## 2019-12-28 HISTORY — DX: Malignant (primary) neoplasm, unspecified: C80.1

## 2019-12-28 LAB — CBC WITH DIFFERENTIAL/PLATELET
Abs Immature Granulocytes: 0.01 10*3/uL (ref 0.00–0.07)
Basophils Absolute: 0.1 10*3/uL (ref 0.0–0.1)
Basophils Relative: 1 %
Eosinophils Absolute: 0.1 10*3/uL (ref 0.0–0.5)
Eosinophils Relative: 2 %
HCT: 44.1 % (ref 39.0–52.0)
Hemoglobin: 14.9 g/dL (ref 13.0–17.0)
Immature Granulocytes: 0 %
Lymphocytes Relative: 37 %
Lymphs Abs: 2.4 10*3/uL (ref 0.7–4.0)
MCH: 32 pg (ref 26.0–34.0)
MCHC: 33.8 g/dL (ref 30.0–36.0)
MCV: 94.6 fL (ref 80.0–100.0)
Monocytes Absolute: 0.6 10*3/uL (ref 0.1–1.0)
Monocytes Relative: 8 %
Neutro Abs: 3.4 10*3/uL (ref 1.7–7.7)
Neutrophils Relative %: 52 %
Platelets: 164 10*3/uL (ref 150–400)
RBC: 4.66 MIL/uL (ref 4.22–5.81)
RDW: 13.2 % (ref 11.5–15.5)
WBC: 6.5 10*3/uL (ref 4.0–10.5)
nRBC: 0 % (ref 0.0–0.2)

## 2019-12-28 LAB — BASIC METABOLIC PANEL
Anion gap: 16 — ABNORMAL HIGH (ref 5–15)
BUN: 14 mg/dL (ref 8–23)
CO2: 20 mmol/L — ABNORMAL LOW (ref 22–32)
Calcium: 9.8 mg/dL (ref 8.9–10.3)
Chloride: 105 mmol/L (ref 98–111)
Creatinine, Ser: 1.24 mg/dL (ref 0.61–1.24)
GFR calc Af Amer: >60 mL/min (ref >60)
GFR calc non Af Amer: 59 mL/min — ABNORMAL LOW (ref >60)
Glucose, Bld: 95 mg/dL (ref 70–99)
Potassium: 3.1 mmol/L — ABNORMAL LOW (ref 3.5–5.1)
Sodium: 141 mmol/L (ref 135–145)

## 2019-12-28 LAB — URINALYSIS, ROUTINE W REFLEX MICROSCOPIC
Bilirubin Urine: NEGATIVE
Glucose, UA: NEGATIVE mg/dL
Ketones, ur: NEGATIVE mg/dL
Nitrite: NEGATIVE
Protein, ur: 100 mg/dL — AB
Specific Gravity, Urine: 1.017 (ref 1.005–1.030)
WBC, UA: >50 WBC/hpf — ABNORMAL HIGH (ref 0–5)
pH: 5 (ref 5.0–8.0)

## 2019-12-28 NOTE — ED Triage Notes (Signed)
Patient reports history of prostate cancer /unable to urinate for several days " I dribble" , denies fever or chills.

## 2019-12-29 DIAGNOSIS — R339 Retention of urine, unspecified: Secondary | ICD-10-CM | POA: Diagnosis not present

## 2019-12-29 MED ORDER — TAMSULOSIN HCL 0.4 MG PO CAPS
0.4000 mg | ORAL_CAPSULE | Freq: Once | ORAL | Status: AC
  Administered 2019-12-29: 0.4 mg via ORAL
  Filled 2019-12-29: qty 1

## 2019-12-29 MED ORDER — TAMSULOSIN HCL 0.4 MG PO CAPS: 0.4000 mg | ORAL_CAPSULE | Freq: Every day | ORAL | 1 refills | Status: AC

## 2019-12-29 MED ORDER — CEPHALEXIN 500 MG PO CAPS: 500.0000 mg | ORAL_CAPSULE | Freq: Four times a day (QID) | ORAL | 0 refills | Status: AC

## 2019-12-29 MED ORDER — AMLODIPINE BESYLATE 10 MG PO TABS: 10.0000 mg | ORAL_TABLET | Freq: Every day | ORAL | 1 refills | Status: AC

## 2019-12-29 MED ORDER — AMLODIPINE BESYLATE 5 MG PO TABS
10.0000 mg | ORAL_TABLET | Freq: Once | ORAL | Status: AC
  Administered 2019-12-29: 10 mg via ORAL
  Filled 2019-12-29: qty 2

## 2019-12-29 MED ORDER — CEPHALEXIN 250 MG PO CAPS
1000.0000 mg | ORAL_CAPSULE | Freq: Once | ORAL | Status: AC
  Administered 2019-12-29: 1000 mg via ORAL
  Filled 2019-12-29: qty 4

## 2019-12-29 NOTE — ED Notes (Signed)
Pt d/c by MD, Pt is provided w/ d/c instructions and follow up care, Pt is ambulatory out of the ED
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
## 2019-12-29 NOTE — ED Notes (Signed)
Pt verbalize he has not been able to pee for x 2 days, Order receive to place foley cath

## 2019-12-30 LAB — URINE CULTURE: Culture: NO GROWTH

## 2020-05-31 ENCOUNTER — Encounter (HOSPITAL_COMMUNITY): Payer: Self-pay

## 2020-05-31 HISTORY — DX: Cerebral infarction, unspecified: I63.9

## 2021-05-03 IMAGING — CT CT HEAD W/O CM
3 of 4 series · 13 of 47 positions shown, 15 images · non-contrast
Comparison: June 09, 2017

CLINICAL DATA: Syncope with altered mental status

EXAM:
CT HEAD WITHOUT CONTRAST
TECHNIQUE: Contiguous axial images were obtained from the base of the skull
through the vertex without intravenous contrast.

[Series 3: head wo · axial · 0.43mm/px · z∈[-133,-18]mm · 7 of 31 slices shown, 9 images]
[im 4/31  brain]
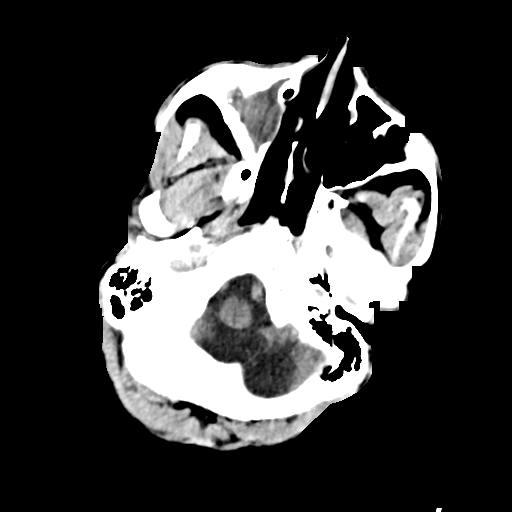
[im 4/31  bone]
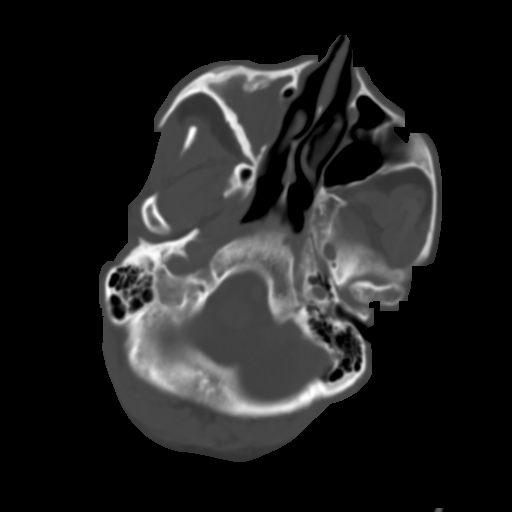
[im 8/31  brain]
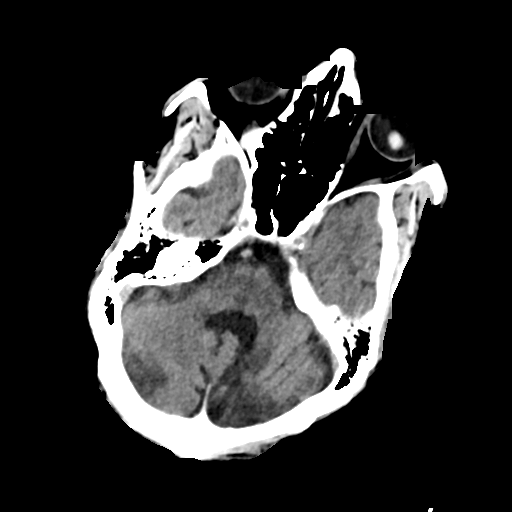
[im 12/31  brain]
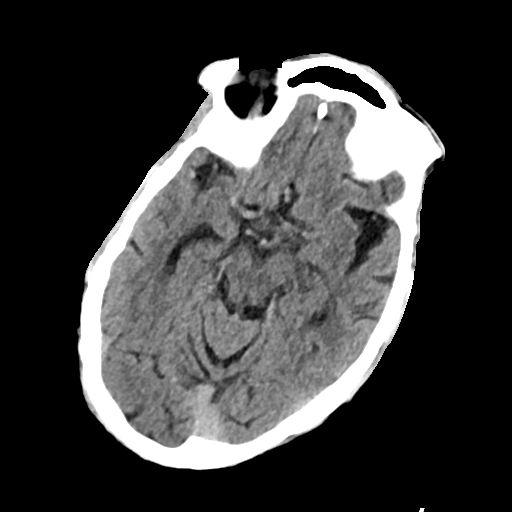
[im 16/31  brain]
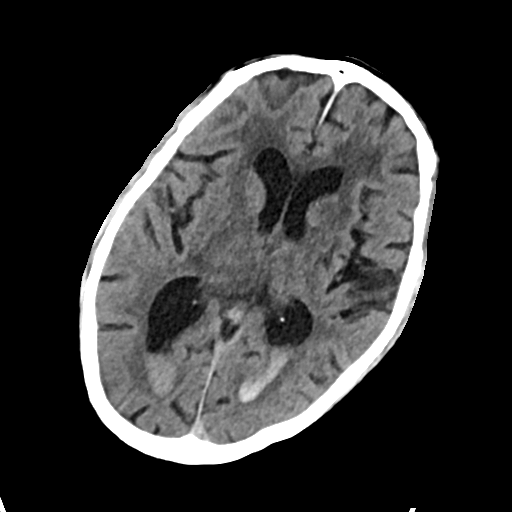
[im 19/31  brain]
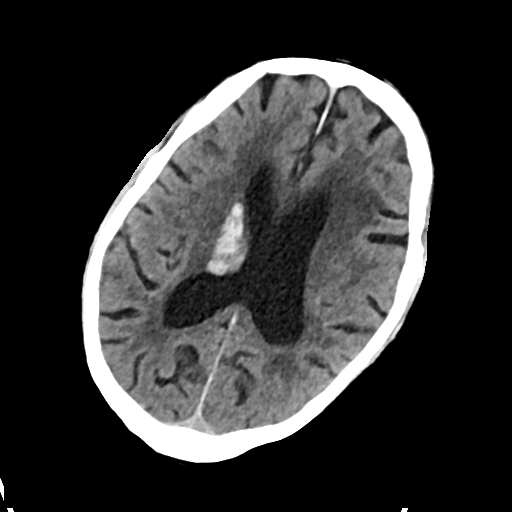
[im 19/31  bone]
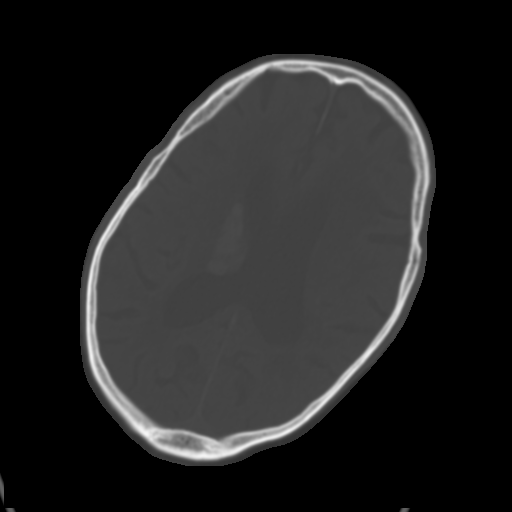
[im 23/31  brain]
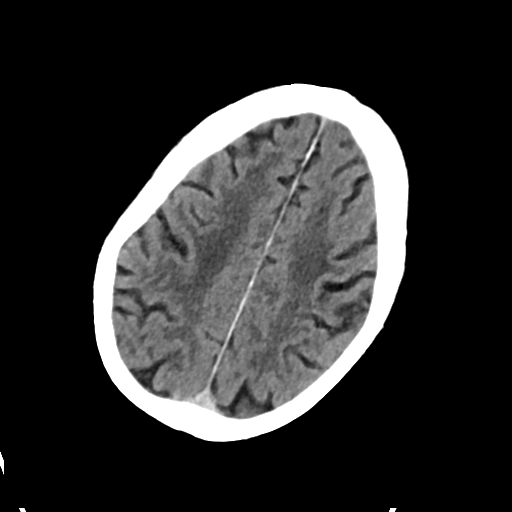
[im 27/31  brain]
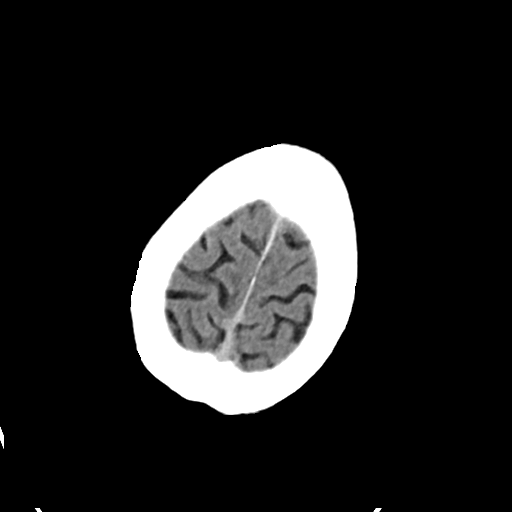

[Series 5: cor soft · coronal · 0.32mm/px · 3 of 68 slices shown]
[im 23/68  brain]
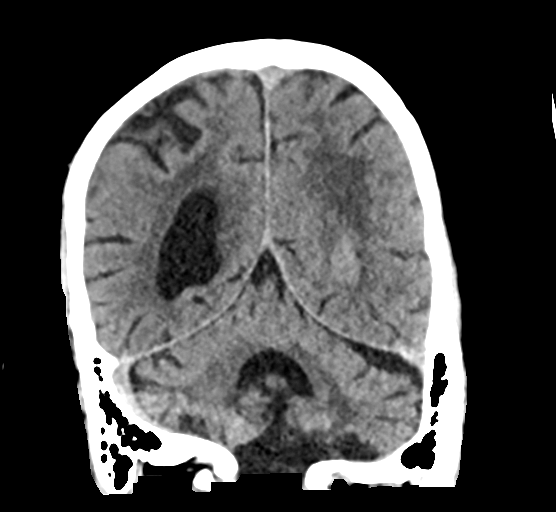
[im 30/68  brain]
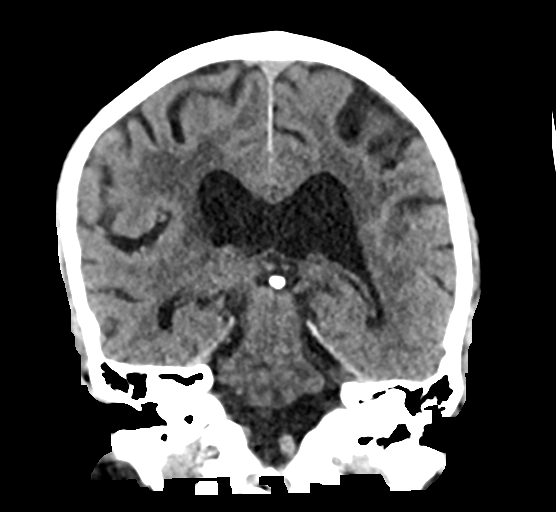
[im 38/68  brain]
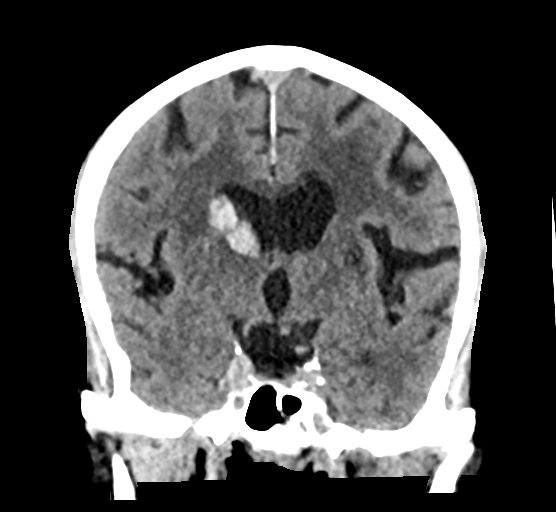

[Series 6: sag soft · sagittal · 0.33mm/px · 3 of 49 slices shown]
[im 17/49  brain]
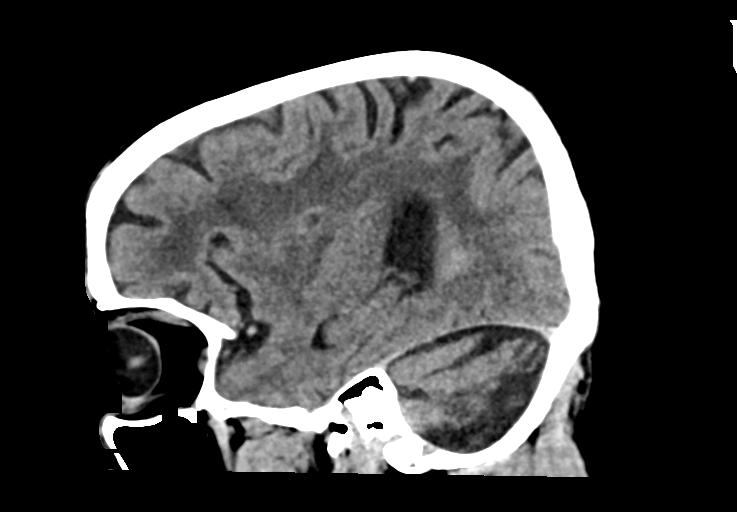
[im 25/49  brain]
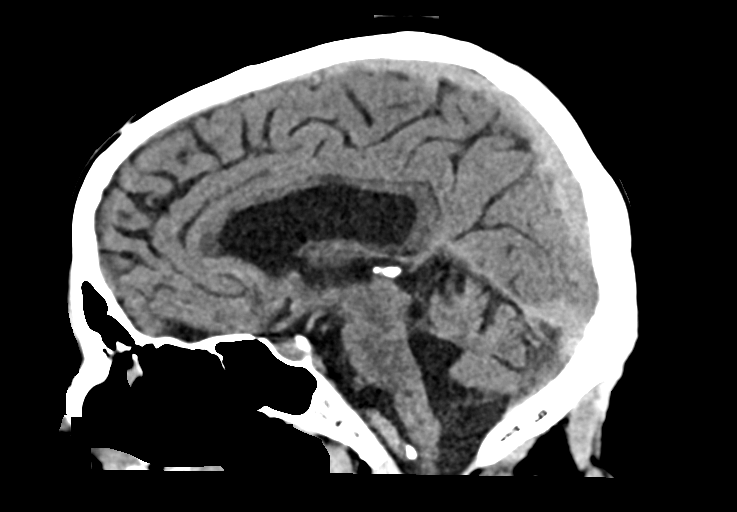
[im 33/49  brain]
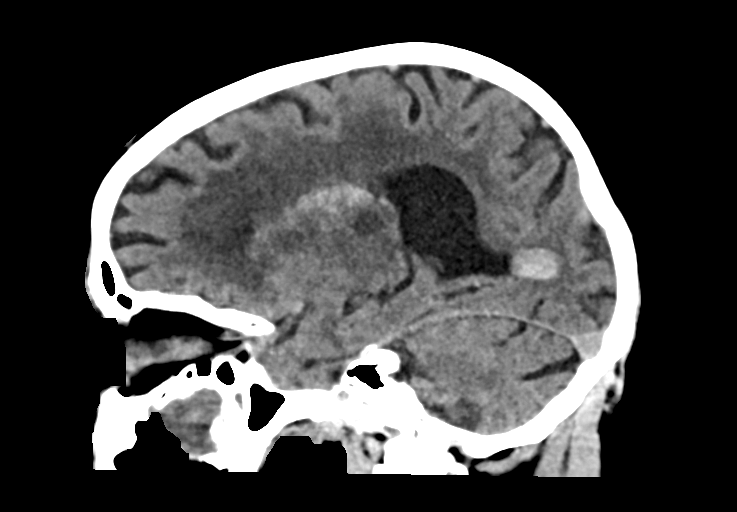

[13 of 47 positions shown; findings below may reference images not displayed]

FINDINGS: Brain: Mild diffuse atrophy is stable. There is hemorrhage arising
along the superior right thalamus involving the posterosuperior
aspect of the right basal ganglia. There is localized mass effect on
the adjacent right lateral ventricle in this region with hemorrhage
extending into the atria of each lateral ventricle. No other foci of
hemorrhage evident. There is decreased attenuation in the superior
right thalamus, likely due to acute infarct. There is a prior
infarct in the mid left thalamus. There is patchy small vessel
disease in the centra semiovale bilaterally, stable.

There is evidence of a prior infarct in the posteroinferior left
cerebellum, not present previously. There is decreased attenuation
in the inferolateral right cerebellum, also apparently due to prior
infarct. There is no extra-axial fluid collection or midline shift.
No mass seen.

Vascular: No hyperdense vessel evident. Calcification is noted in
the distal left vertebral artery and in the carotid siphon regions
bilaterally.

Skull: The bony calvarium appears intact.

Sinuses/Orbits: There is opacification of the right maxillary antrum
with extension of opacification into the lateral right nares. There
is mucosal thickening and opacification in several ethmoid air
cells. There is apparent lens dislocation on the left with the lens
posteriorly displaced. No intraorbital mass.

Other: Mastoid air cells are clear.
IMPRESSION: 1. Hemorrhage arising in the posterosuperior right basal ganglia
involving the superior right thalamus. There is localized impression
on the immediately adjacent lateral ventricle with hemorrhage
extending into the atrium of each lateral ventricle. The focus of
hemorrhage measures 3.2 x 1.4 cm. Suspect hypertensive hemorrhages
most likely etiology given location and appearance. No associated
mass.

2.  There is atrophy with periventricular small vessel disease.

3. Prior infarct in the posteroinferior left cerebellar hemisphere
with smaller infarct in the inferolateral right cerebellum.

4. Suspect acute infarct involving much of the right thalamus.
Smaller old infarct left thalamus.

5. Extensive opacification of the right maxillary antrum with
questionable antrochoanal polyp extending into the right nares.

6.  Apparent posterior dislocation of the lens of the left eye.

7.  Multiple foci of arterial vascular calcification.

Critical Value/emergent results were called by telephone at the time
of interpretation on 05/22/2020 at [DATE] to provider Dr. Liscano physician, who verbally acknowledged these results.
